# Patient Record
Sex: Male | Born: 1960 | Race: White | Hispanic: No | Marital: Married | Smoking: Never smoker
Health system: Southern US, Community
[De-identification: ages and names within clinical notes are randomized; demographics above are authoritative.]

## PROBLEM LIST (undated history)

## (undated) DIAGNOSIS — I2510 Atherosclerotic heart disease of native coronary artery without angina pectoris: Secondary | ICD-10-CM

## (undated) DIAGNOSIS — E785 Hyperlipidemia, unspecified: Secondary | ICD-10-CM

## (undated) DIAGNOSIS — I10 Essential (primary) hypertension: Secondary | ICD-10-CM

## (undated) DIAGNOSIS — I213 ST elevation (STEMI) myocardial infarction of unspecified site: Secondary | ICD-10-CM

## (undated) HISTORY — DX: Atherosclerotic heart disease of native coronary artery without angina pectoris: I25.10

## (undated) HISTORY — DX: Essential (primary) hypertension: I10

## (undated) HISTORY — DX: Hyperlipidemia, unspecified: E78.5

---

## 2011-03-28 DIAGNOSIS — E785 Hyperlipidemia, unspecified: Secondary | ICD-10-CM | POA: Insufficient documentation

## 2011-03-28 DIAGNOSIS — I10 Essential (primary) hypertension: Secondary | ICD-10-CM | POA: Insufficient documentation

## 2013-03-04 DIAGNOSIS — I213 ST elevation (STEMI) myocardial infarction of unspecified site: Secondary | ICD-10-CM

## 2013-03-04 HISTORY — DX: ST elevation (STEMI) myocardial infarction of unspecified site: I21.3

## 2013-03-15 DIAGNOSIS — I2510 Atherosclerotic heart disease of native coronary artery without angina pectoris: Secondary | ICD-10-CM

## 2013-03-15 HISTORY — DX: Atherosclerotic heart disease of native coronary artery without angina pectoris: I25.10

## 2013-03-15 HISTORY — PX: CORONARY ANGIOPLASTY WITH STENT PLACEMENT: SHX49

## 2013-04-08 ENCOUNTER — Encounter: Payer: Self-pay | Admitting: Cardiovascular Disease

## 2013-04-08 ENCOUNTER — Ambulatory Visit (INDEPENDENT_AMBULATORY_CARE_PROVIDER_SITE_OTHER): Payer: BC Managed Care – PPO | Admitting: Cardiovascular Disease

## 2013-04-08 VITALS — BP 102/66 | Ht 68.5 in | Wt 195.3 lb

## 2013-04-08 DIAGNOSIS — Z9861 Coronary angioplasty status: Secondary | ICD-10-CM

## 2013-04-08 DIAGNOSIS — I2510 Atherosclerotic heart disease of native coronary artery without angina pectoris: Secondary | ICD-10-CM | POA: Insufficient documentation

## 2013-04-08 DIAGNOSIS — Z79899 Other long term (current) drug therapy: Secondary | ICD-10-CM

## 2013-04-08 DIAGNOSIS — E782 Mixed hyperlipidemia: Secondary | ICD-10-CM

## 2013-04-08 NOTE — Patient Instructions (Addendum)
Your physician wants you to follow-up in 6 months Dr San Morelle will receive a reminder letter in the mail two months in advance. If you don't receive a letter, please call our office to schedule the follow-up appointment.  IN 2 MONTHS LABS LIPID, LIVER  PROFILE  You have been referred to cardiac rehab at [facility].

## 2013-04-08 NOTE — Assessment & Plan Note (Signed)
Patient had a STEMI in [place] last month. He went to the [facility] ER where he was noted to have ST segment elevation in the anterior leads. He received intravenous lytics and was transferred to [facility] where he underwent emergent cardiac catheterization revealing an occluded RCA which was stented with a 3 mm x 18 bone along the sides drug-eluting stent. His EF was normal as was his LAD and circumflex coronary artery. His risk factors include mild hyperlipidemia. He is on dual antiplatelet therapy including aspirin and Plavix.

## 2013-04-08 NOTE — Assessment & Plan Note (Signed)
On statin therapy. We will recheck a lipid and liver profile in 2 months 

## 2013-04-08 NOTE — Progress Notes (Signed)
04/08/2013 Dierdre Harness   Oct 09, 1960  [id number]  Primary Physician No PCP Per Patient Primary Cardiologist: Runell Gess MD Roseanne Reno   HPI:  Mr. 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 He was self-referred to be established in our practice because of recent STEMI that occurred in [place]. He underwent emergency cardiac catheterization, PCI and stenting of his RCA using a drug-eluting stent. He was placed on appropriate oncologic therapy including beta blocker, ACE inhibitor, statin drug. He is unable to therapy. He's had no recurrent symptoms. His only cardiovascular risk factor was mild hyperlipidemia.   Current Outpatient Prescriptions  Medication Sig Dispense Refill  . aspirin EC 81 MG tablet Take 81 mg by mouth daily.      Marland Kitchen atorvastatin (LIPITOR) 80 MG tablet Take 80 mg by mouth daily.       . clopidogrel (PLAVIX) 75 MG tablet Take 75 mg by mouth daily.       Marland Kitchen lisinopril (PRINIVIL,ZESTRIL) 5 MG tablet Take 5 mg by mouth daily.       . metoprolol tartrate (LOPRESSOR) 25 MG tablet Take 25 mg by mouth 2 (two) times daily.       Marland Kitchen NITROSTAT 0.4 MG SL tablet Place 0.4 mg under the tongue every 5 (five) minutes as needed.       . pantoprazole (PROTONIX) 40 MG tablet Take 40 mg by mouth daily.        No current facility-administered medications for this visit.    No Known Allergies  History   Social History  . Marital Status: Married    Spouse Name: N/A    Number of Children: N/A  . Years of Education: N/A   Occupational History  . Not on file.   Social History Main Topics  . Smoking status: Never Smoker   . Smokeless tobacco: Not on file  . Alcohol Use: Yes  . Drug Use: No  . Sexual Activity: Not on file   Other Topics Concern  . Not on file   Social History Narrative  . No narrative on file     Review of Systems: General: negative for chills,  fever, night sweats or weight changes.  Cardiovascular: negative for chest pain, dyspnea on exertion, edema, orthopnea, palpitations, paroxysmal nocturnal dyspnea or shortness of breath Dermatological: negative for rash Respiratory: negative for cough or wheezing Urologic: negative for hematuria Abdominal: negative for nausea, vomiting, diarrhea, bright red blood per rectum, melena, or hematemesis Neurologic: negative for visual changes, syncope, or dizziness All other systems reviewed and are otherwise negative except as noted above.    Blood pressure 102/66, height 5' 8.5" (1.74 m), weight 195 lb 4.8 oz (88.587 kg).  General appearance: alert and no distress Neck: no adenopathy, no carotid bruit, no JVD, supple, symmetrical, trachea midline and thyroid not enlarged, symmetric, no tenderness/mass/nodules Lungs: clear to auscultation bilaterally Heart: regular rate and rhythm, S1, S2 normal, no murmur, click, rub or gallop Abdomen: soft, non-tender; bowel sounds normal; no masses,  no organomegaly Extremities: extremities normal, atraumatic, no cyanosis or edema Pulses: 2+ and symmetric  EKG sinus bradycardia at 54 with inferior Q waves and T wave inversions in lead III and F  ASSESSMENT AND PLAN:   Coronary artery disease Patient had a STEMI in [place] last month. He went to the [facility] ER where he was noted to have ST segment elevation in the  anterior leads. He received intravenous lytics and was transferred to [facility] where he underwent emergent cardiac catheterization revealing an occluded RCA which was stented with a 3 mm x 18 bone along the sides drug-eluting stent. His EF was normal as was his LAD and circumflex coronary artery. His risk factors include mild hyperlipidemia. He is on dual antiplatelet therapy including aspirin and Plavix.  Hyperlipidemia On statin therapy. We will recheck a lipid and liver profile in 2 months.      Runell Gess MD FACP,FACC,FAHA,  [facility] 04/08/2013 11:46 AM

## 2013-04-11 ENCOUNTER — Ambulatory Visit: Payer: Self-pay | Admitting: Cardiovascular Disease

## 2013-04-26 ENCOUNTER — Telehealth: Payer: Self-pay | Admitting: Cardiovascular Disease

## 2013-04-26 NOTE — Telephone Encounter (Signed)
Spoke w/ Deedie in Scheduling.  Informed Cardiac Rehab schedules their own appts..  Deedie called Cardiac Rehab and informed they do have a work que and schedule their appts.  Staff will look into pt's referral and call back if more information needed.  Call to wife and explained process.  Verbalized understanding.

## 2013-04-26 NOTE — Telephone Encounter (Signed)
Referral already made to Cardiac Rehab on 10.6.14.  Returned call to Leonardo, pt's wife.  Informed message received and referral has been made.  Informed RN will contact Scheduling r/t delay and try to get appt scheduled as soon as possible.  Wife also wants to know if pt can do any exercising before being seen in Cardiac Rehab.  Stated she disagrees w/ Dr. Allyson Sabal on that b/c pt's lack of exercise is what got him in this situation.  RN explained to wife that Cardiac Rehab is to assess pt's exercise tolerance to know what his limitations are, if any, and to help his heart get back to functionally as normal as possible.  Wife advised to have pt comply w/ Dr. Hazle Coca advice to avoid exercising until evaluated in Cardiac Rehab.  Wife verbalized understanding and will await call from scheduling.    Message forwarded to Scheduling to contact pt/wife to set up appt.

## 2013-04-26 NOTE — Telephone Encounter (Signed)
Please call-still have not heard anything getting into Cardiac Rehab.Does she need to do anything to speed this up?Marland Kitchen If not home please call  [phone].

## 2013-05-08 ENCOUNTER — Encounter: Payer: Self-pay | Admitting: Cardiovascular Disease

## 2013-05-09 ENCOUNTER — Encounter (HOSPITAL_COMMUNITY)
Admission: RE | Admit: 2013-05-09 | Discharge: 2013-05-09 | Disposition: A | Discharge status: Home / Self Care | Payer: BC Managed Care – PPO | Source: Ambulatory Visit | Attending: Cardiovascular Disease | Admitting: Cardiovascular Disease

## 2013-05-09 DIAGNOSIS — I2510 Atherosclerotic heart disease of native coronary artery without angina pectoris: Secondary | ICD-10-CM | POA: Insufficient documentation

## 2013-05-09 DIAGNOSIS — Z9861 Coronary angioplasty status: Secondary | ICD-10-CM | POA: Insufficient documentation

## 2013-05-09 DIAGNOSIS — Z5189 Encounter for other specified aftercare: Secondary | ICD-10-CM | POA: Insufficient documentation

## 2013-05-09 NOTE — Progress Notes (Signed)
Cardiac Rehab Medication Review by a Pharmacist  Does the patient  feel that his/her medications are working for him/her?  yes  Has the patient been experiencing any side effects to the medications prescribed?  yes  Does the patient measure his/her own blood pressure or blood glucose at home?  yes   Does the patient have any problems obtaining medications due to transportation or finances?   no  Understanding of regimen: excellent Understanding of indications: excellent Potential of compliance: excellent    Pharmacist comments: Patient has been feeling sluggish and is unsure if that is due to the medications. He is very aware of the benefits of taking his medications and thinks they are working for him.  He inquires about the possibility of stopping any of the medications in the future.  He was educated that likely these medications will continue due to their benefits.  He states he understands and will continue them as prescribed. Overall, he is doing well.   Thank you, Piedad Climes, PharmD Clinical Pharmacist - Resident Pager: [phone] Pharmacy: [phone] 05/09/2013 9:10 AM

## 2013-05-13 ENCOUNTER — Encounter (HOSPITAL_COMMUNITY)
Admission: RE | Admit: 2013-05-13 | Discharge: 2013-05-13 | Disposition: A | Discharge status: Home / Self Care | Payer: BC Managed Care – PPO | Source: Ambulatory Visit | Attending: Cardiovascular Disease | Admitting: Cardiovascular Disease

## 2013-05-13 NOTE — Progress Notes (Signed)
Pt started cardiac rehab today.  Pt tolerated light exercise without difficulty. Telemetry rhythm Sinus without ectopy. Vital signs stable. Will continue to monitor the patient throughout  the program.  

## 2013-05-15 ENCOUNTER — Encounter (HOSPITAL_COMMUNITY)
Admission: RE | Admit: 2013-05-15 | Discharge: 2013-05-15 | Disposition: A | Discharge status: Home / Self Care | Payer: BC Managed Care – PPO | Source: Ambulatory Visit | Attending: Cardiovascular Disease | Admitting: Cardiovascular Disease

## 2013-05-17 ENCOUNTER — Encounter (HOSPITAL_COMMUNITY)
Admission: RE | Admit: 2013-05-17 | Discharge: 2013-05-17 | Disposition: A | Discharge status: Home / Self Care | Payer: BC Managed Care – PPO | Source: Ambulatory Visit | Attending: Cardiovascular Disease | Admitting: Cardiovascular Disease

## 2013-05-20 ENCOUNTER — Encounter (HOSPITAL_COMMUNITY)
Admission: RE | Admit: 2013-05-20 | Discharge: 2013-05-20 | Disposition: A | Discharge status: Home / Self Care | Payer: BC Managed Care – PPO | Source: Ambulatory Visit | Attending: Cardiovascular Disease | Admitting: Cardiovascular Disease

## 2013-05-22 ENCOUNTER — Encounter (HOSPITAL_COMMUNITY)
Admission: RE | Admit: 2013-05-22 | Discharge: 2013-05-22 | Disposition: A | Discharge status: Home / Self Care | Payer: BC Managed Care – PPO | Source: Ambulatory Visit | Attending: Cardiovascular Disease | Admitting: Cardiovascular Disease

## 2013-05-22 NOTE — Progress Notes (Signed)
Reviewed home exercise with pt today.  Pt plans to walk at home for exercise.  Reviewed THR, pulse, RPE, sign and symptoms, NTG use, and when to call 911 or MD.  Pt voiced understanding. Devesh Monforte, MA, ACSM RCEP   

## 2013-05-24 ENCOUNTER — Encounter (HOSPITAL_COMMUNITY)
Admission: RE | Admit: 2013-05-24 | Discharge: 2013-05-24 | Disposition: A | Discharge status: Home / Self Care | Payer: BC Managed Care – PPO | Source: Ambulatory Visit | Attending: Cardiovascular Disease | Admitting: Cardiovascular Disease

## 2013-05-27 ENCOUNTER — Encounter (HOSPITAL_COMMUNITY)
Admission: RE | Admit: 2013-05-27 | Discharge: 2013-05-27 | Disposition: A | Discharge status: Home / Self Care | Payer: BC Managed Care – PPO | Source: Ambulatory Visit | Attending: Cardiovascular Disease | Admitting: Cardiovascular Disease

## 2013-05-29 ENCOUNTER — Encounter (HOSPITAL_COMMUNITY)
Admission: RE | Admit: 2013-05-29 | Discharge: 2013-05-29 | Disposition: A | Discharge status: Home / Self Care | Payer: BC Managed Care – PPO | Source: Ambulatory Visit | Attending: Cardiovascular Disease | Admitting: Cardiovascular Disease

## 2013-05-31 ENCOUNTER — Encounter (HOSPITAL_COMMUNITY): Payer: BC Managed Care – PPO

## 2013-06-03 ENCOUNTER — Encounter (HOSPITAL_COMMUNITY)
Admission: RE | Admit: 2013-06-03 | Discharge: 2013-06-03 | Disposition: A | Discharge status: Home / Self Care | Payer: BC Managed Care – PPO | Source: Ambulatory Visit | Attending: Cardiovascular Disease | Admitting: Cardiovascular Disease

## 2013-06-03 DIAGNOSIS — I2510 Atherosclerotic heart disease of native coronary artery without angina pectoris: Secondary | ICD-10-CM | POA: Insufficient documentation

## 2013-06-03 DIAGNOSIS — Z9861 Coronary angioplasty status: Secondary | ICD-10-CM | POA: Insufficient documentation

## 2013-06-03 DIAGNOSIS — Z5189 Encounter for other specified aftercare: Secondary | ICD-10-CM | POA: Insufficient documentation

## 2013-06-05 ENCOUNTER — Encounter (HOSPITAL_COMMUNITY)
Admission: RE | Admit: 2013-06-05 | Discharge: 2013-06-05 | Disposition: A | Discharge status: Home / Self Care | Payer: BC Managed Care – PPO | Source: Ambulatory Visit | Attending: Cardiovascular Disease | Admitting: Cardiovascular Disease

## 2013-06-07 ENCOUNTER — Encounter (HOSPITAL_COMMUNITY)
Admission: RE | Admit: 2013-06-07 | Discharge: 2013-06-07 | Disposition: A | Discharge status: Home / Self Care | Payer: BC Managed Care – PPO | Source: Ambulatory Visit | Attending: Cardiovascular Disease | Admitting: Cardiovascular Disease

## 2013-06-10 ENCOUNTER — Encounter (HOSPITAL_COMMUNITY)
Admission: RE | Admit: 2013-06-10 | Discharge: 2013-06-10 | Disposition: A | Discharge status: Home / Self Care | Payer: BC Managed Care – PPO | Source: Ambulatory Visit | Attending: Cardiovascular Disease | Admitting: Cardiovascular Disease

## 2013-06-12 ENCOUNTER — Encounter (HOSPITAL_COMMUNITY)
Admission: RE | Admit: 2013-06-12 | Discharge: 2013-06-12 | Disposition: A | Discharge status: Home / Self Care | Payer: BC Managed Care – PPO | Source: Ambulatory Visit | Attending: Cardiovascular Disease | Admitting: Cardiovascular Disease

## 2013-06-12 NOTE — Progress Notes (Signed)
Quantae Martel 52 y.o. male Nutrition Note Spoke with pt. Pt's wife is an RD. Nutrition Plan and Nutrition Survey goals reviewed with pt. Pt is following Step 2 of the Therapeutic Lifestyle Changes diet. Pt wants to lose wt. Pt has been trying to lose wt by exercising more and portion control. Wt loss tips reviewed. Pt expressed understanding of the information reviewed. Pt aware of nutrition education classes offered and plans on attending nutrition classes.  Nutrition Diagnosis   Food-and nutrition-related knowledge deficit related to lack of exposure to information as related to diagnosis of: ? CVD ?    Overweight related to excessive energy intake as evidenced by a BMI of 28.9    Nutrition RX/ Estimated Daily Nutrition Needs for: wt loss  1600-2100 Kcal, 45-55 gm fat, 10-14 gm sat fat, 1.6-2.1 gm trans-fat, <1500 mg sodium   Nutrition Intervention   Pt's individual nutrition plan including cholesterol goals reviewed with pt.   Benefits of adopting Therapeutic Lifestyle Changes discussed when Medficts reviewed.   Pt to attend the Portion Distortion class   Pt to attend the  ? Nutrition I class                     ? Nutrition II class   Continue client-centered nutrition education by RD, as part of interdisciplinary care.  Goal(s)   Pt to identify food quantities necessary to achieve: ? wt loss to a goal wt of 172-190 lb (78.6-86.8 kg) at graduation from cardiac rehab.   Monitor and Evaluate progress toward nutrition goal with team. Nutrition Risk:  Low   Mickle Plumb, M.Ed, RD, LDN, CDE 06/12/2013 3:41 PM

## 2013-06-14 ENCOUNTER — Encounter (HOSPITAL_COMMUNITY)
Admission: RE | Admit: 2013-06-14 | Discharge: 2013-06-14 | Disposition: A | Discharge status: Home / Self Care | Payer: BC Managed Care – PPO | Source: Ambulatory Visit | Attending: Cardiovascular Disease | Admitting: Cardiovascular Disease

## 2013-06-14 LAB — LIPID PANEL
Cholesterol: 107 mg/dL (ref 0–200)
HDL: 47 mg/dL (ref >39)
LDL Cholesterol: 46 mg/dL (ref 0–99)
Total CHOL/HDL Ratio: 2.3 Ratio
Triglycerides: 70 mg/dL (ref <150)
VLDL: 14 mg/dL (ref 0–40)

## 2013-06-14 LAB — HEPATIC FUNCTION PANEL
ALT: 22 U/L (ref 0–53)
AST: 18 U/L (ref 0–37)
Albumin: 4.5 g/dL (ref 3.5–5.2)
Alkaline Phosphatase: 49 U/L (ref 39–117)
Bilirubin, Direct: 0.2 mg/dL (ref 0.0–0.3)
Indirect Bilirubin: 0.6 mg/dL (ref 0.0–0.9)
Total Bilirubin: 0.8 mg/dL (ref 0.3–1.2)
Total Protein: 7.3 g/dL (ref 6.0–8.3)

## 2013-06-17 ENCOUNTER — Encounter (HOSPITAL_COMMUNITY)
Admission: RE | Admit: 2013-06-17 | Discharge: 2013-06-17 | Disposition: A | Discharge status: Home / Self Care | Payer: BC Managed Care – PPO | Source: Ambulatory Visit | Attending: Cardiovascular Disease | Admitting: Cardiovascular Disease

## 2013-06-19 ENCOUNTER — Encounter (HOSPITAL_COMMUNITY)
Admission: RE | Admit: 2013-06-19 | Discharge: 2013-06-19 | Disposition: A | Discharge status: Home / Self Care | Payer: BC Managed Care – PPO | Source: Ambulatory Visit | Attending: Cardiovascular Disease | Admitting: Cardiovascular Disease

## 2013-06-21 ENCOUNTER — Encounter (HOSPITAL_COMMUNITY)
Admission: RE | Admit: 2013-06-21 | Discharge: 2013-06-21 | Disposition: A | Discharge status: Home / Self Care | Payer: BC Managed Care – PPO | Source: Ambulatory Visit | Attending: Cardiovascular Disease | Admitting: Cardiovascular Disease

## 2013-06-21 ENCOUNTER — Encounter: Payer: Self-pay | Admitting: *Deleted

## 2013-06-23 ENCOUNTER — Encounter: Payer: Self-pay | Admitting: *Deleted

## 2013-06-24 ENCOUNTER — Encounter (HOSPITAL_COMMUNITY): Payer: BC Managed Care – PPO

## 2013-06-26 ENCOUNTER — Encounter (HOSPITAL_COMMUNITY): Payer: BC Managed Care – PPO

## 2013-06-28 ENCOUNTER — Encounter (HOSPITAL_COMMUNITY): Payer: BC Managed Care – PPO

## 2013-07-01 ENCOUNTER — Encounter (HOSPITAL_COMMUNITY)
Admission: RE | Admit: 2013-07-01 | Discharge: 2013-07-01 | Disposition: A | Discharge status: Home / Self Care | Payer: BC Managed Care – PPO | Source: Ambulatory Visit | Attending: Cardiovascular Disease | Admitting: Cardiovascular Disease

## 2013-07-03 ENCOUNTER — Encounter (HOSPITAL_COMMUNITY)
Admission: RE | Admit: 2013-07-03 | Discharge: 2013-07-03 | Disposition: A | Discharge status: Home / Self Care | Payer: BC Managed Care – PPO | Source: Ambulatory Visit | Attending: Cardiovascular Disease | Admitting: Cardiovascular Disease

## 2013-07-05 ENCOUNTER — Encounter (HOSPITAL_COMMUNITY)
Admission: RE | Admit: 2013-07-05 | Discharge: 2013-07-05 | Disposition: A | Discharge status: Home / Self Care | Payer: BC Managed Care – PPO | Source: Ambulatory Visit | Attending: Cardiovascular Disease | Admitting: Cardiovascular Disease

## 2013-07-05 DIAGNOSIS — I2510 Atherosclerotic heart disease of native coronary artery without angina pectoris: Secondary | ICD-10-CM | POA: Insufficient documentation

## 2013-07-05 DIAGNOSIS — Z5189 Encounter for other specified aftercare: Secondary | ICD-10-CM | POA: Insufficient documentation

## 2013-07-05 DIAGNOSIS — Z9861 Coronary angioplasty status: Secondary | ICD-10-CM | POA: Insufficient documentation

## 2013-07-08 ENCOUNTER — Encounter (HOSPITAL_COMMUNITY)
Admission: RE | Admit: 2013-07-08 | Discharge: 2013-07-08 | Disposition: A | Discharge status: Home / Self Care | Payer: BC Managed Care – PPO | Source: Ambulatory Visit | Attending: Cardiovascular Disease | Admitting: Cardiovascular Disease

## 2013-07-10 ENCOUNTER — Encounter (HOSPITAL_COMMUNITY)
Admission: RE | Admit: 2013-07-10 | Discharge: 2013-07-10 | Disposition: A | Discharge status: Home / Self Care | Payer: BC Managed Care – PPO | Source: Ambulatory Visit | Attending: Cardiovascular Disease | Admitting: Cardiovascular Disease

## 2013-07-12 ENCOUNTER — Encounter (HOSPITAL_COMMUNITY)
Admission: RE | Admit: 2013-07-12 | Discharge: 2013-07-12 | Disposition: A | Discharge status: Home / Self Care | Payer: BC Managed Care – PPO | Source: Ambulatory Visit | Attending: Cardiovascular Disease | Admitting: Cardiovascular Disease

## 2013-07-15 ENCOUNTER — Telehealth (HOSPITAL_COMMUNITY): Payer: Self-pay | Admitting: General Practice

## 2013-07-15 ENCOUNTER — Encounter (HOSPITAL_COMMUNITY): Payer: BC Managed Care – PPO

## 2013-07-17 ENCOUNTER — Encounter (HOSPITAL_COMMUNITY)
Admission: RE | Admit: 2013-07-17 | Discharge: 2013-07-17 | Disposition: A | Discharge status: Home / Self Care | Payer: BC Managed Care – PPO | Source: Ambulatory Visit | Attending: Cardiovascular Disease | Admitting: Cardiovascular Disease

## 2013-07-19 ENCOUNTER — Encounter (HOSPITAL_COMMUNITY)
Admission: RE | Admit: 2013-07-19 | Discharge: 2013-07-19 | Disposition: A | Discharge status: Home / Self Care | Payer: BC Managed Care – PPO | Source: Ambulatory Visit | Attending: Cardiovascular Disease | Admitting: Cardiovascular Disease

## 2013-07-22 ENCOUNTER — Encounter (HOSPITAL_COMMUNITY)
Admission: RE | Admit: 2013-07-22 | Discharge: 2013-07-22 | Disposition: A | Discharge status: Home / Self Care | Payer: BC Managed Care – PPO | Source: Ambulatory Visit | Attending: Cardiovascular Disease | Admitting: Cardiovascular Disease

## 2013-07-24 ENCOUNTER — Encounter (HOSPITAL_COMMUNITY)
Admission: RE | Admit: 2013-07-24 | Discharge: 2013-07-24 | Disposition: A | Discharge status: Home / Self Care | Payer: BC Managed Care – PPO | Source: Ambulatory Visit | Attending: Cardiovascular Disease | Admitting: Cardiovascular Disease

## 2013-07-26 ENCOUNTER — Encounter (HOSPITAL_COMMUNITY)
Admission: RE | Admit: 2013-07-26 | Discharge: 2013-07-26 | Disposition: A | Discharge status: Home / Self Care | Payer: BC Managed Care – PPO | Source: Ambulatory Visit | Attending: Cardiovascular Disease | Admitting: Cardiovascular Disease

## 2013-07-29 ENCOUNTER — Encounter (HOSPITAL_COMMUNITY)
Admission: RE | Admit: 2013-07-29 | Discharge: 2013-07-29 | Disposition: A | Discharge status: Home / Self Care | Payer: BC Managed Care – PPO | Source: Ambulatory Visit | Attending: Cardiovascular Disease | Admitting: Cardiovascular Disease

## 2013-07-31 ENCOUNTER — Encounter (HOSPITAL_COMMUNITY)
Admission: RE | Admit: 2013-07-31 | Discharge: 2013-07-31 | Disposition: A | Discharge status: Home / Self Care | Payer: BC Managed Care – PPO | Source: Ambulatory Visit | Attending: Cardiovascular Disease | Admitting: Cardiovascular Disease

## 2013-08-02 ENCOUNTER — Encounter (HOSPITAL_COMMUNITY)
Admission: RE | Admit: 2013-08-02 | Discharge: 2013-08-02 | Disposition: A | Discharge status: Home / Self Care | Payer: BC Managed Care – PPO | Source: Ambulatory Visit | Attending: Cardiovascular Disease | Admitting: Cardiovascular Disease

## 2013-08-05 ENCOUNTER — Encounter (HOSPITAL_COMMUNITY)
Admission: RE | Admit: 2013-08-05 | Discharge: 2013-08-05 | Disposition: A | Discharge status: Home / Self Care | Payer: BC Managed Care – PPO | Source: Ambulatory Visit | Attending: Cardiovascular Disease | Admitting: Cardiovascular Disease

## 2013-08-05 DIAGNOSIS — Z5189 Encounter for other specified aftercare: Secondary | ICD-10-CM | POA: Insufficient documentation

## 2013-08-05 DIAGNOSIS — I2510 Atherosclerotic heart disease of native coronary artery without angina pectoris: Secondary | ICD-10-CM | POA: Insufficient documentation

## 2013-08-05 DIAGNOSIS — Z9861 Coronary angioplasty status: Secondary | ICD-10-CM | POA: Insufficient documentation

## 2013-08-07 ENCOUNTER — Encounter (HOSPITAL_COMMUNITY)
Admission: RE | Admit: 2013-08-07 | Discharge: 2013-08-07 | Disposition: A | Discharge status: Home / Self Care | Payer: BC Managed Care – PPO | Source: Ambulatory Visit | Attending: Cardiovascular Disease | Admitting: Cardiovascular Disease

## 2013-08-09 ENCOUNTER — Encounter (HOSPITAL_COMMUNITY)
Admission: RE | Admit: 2013-08-09 | Discharge: 2013-08-09 | Disposition: A | Discharge status: Home / Self Care | Payer: BC Managed Care – PPO | Source: Ambulatory Visit | Attending: Cardiovascular Disease | Admitting: Cardiovascular Disease

## 2013-08-12 ENCOUNTER — Encounter (HOSPITAL_COMMUNITY)
Admission: RE | Admit: 2013-08-12 | Discharge: 2013-08-12 | Disposition: A | Discharge status: Home / Self Care | Payer: BC Managed Care – PPO | Source: Ambulatory Visit | Attending: Cardiovascular Disease | Admitting: Cardiovascular Disease

## 2013-08-14 ENCOUNTER — Encounter (HOSPITAL_COMMUNITY)
Admission: RE | Admit: 2013-08-14 | Discharge: 2013-08-14 | Disposition: A | Discharge status: Home / Self Care | Payer: BC Managed Care – PPO | Source: Ambulatory Visit | Attending: Cardiovascular Disease | Admitting: Cardiovascular Disease

## 2013-08-14 NOTE — Progress Notes (Signed)
[employer] graduates today. Mardelle Mattendy plans to continue exercise on his own.

## 2013-08-16 ENCOUNTER — Encounter (HOSPITAL_COMMUNITY): Payer: BC Managed Care – PPO

## 2013-09-20 ENCOUNTER — Telehealth: Payer: Self-pay | Admitting: Cardiovascular Disease

## 2013-09-20 MED ORDER — COENZYME Q10 30 MG PO CAPS: 30.0000 mg | ORAL_CAPSULE | Freq: Every day | ORAL | Status: AC

## 2013-09-20 MED ORDER — CLOPIDOGREL BISULFATE 75 MG PO TABS: 75.0000 mg | ORAL_TABLET | Freq: Every day | ORAL | Status: DC

## 2013-09-20 MED ORDER — METOPROLOL TARTRATE 25 MG PO TABS: 25.0000 mg | ORAL_TABLET | Freq: Two times a day (BID) | ORAL | Status: DC

## 2013-09-20 MED ORDER — LISINOPRIL 5 MG PO TABS: 5.0000 mg | ORAL_TABLET | Freq: Every day | ORAL | Status: DC

## 2013-09-20 MED ORDER — ATORVASTATIN CALCIUM 80 MG PO TABS: 80.0000 mg | ORAL_TABLET | Freq: Every day | ORAL | Status: DC

## 2013-09-20 MED ORDER — NITROGLYCERIN 0.4 MG SL SUBL: 0.4000 mg | SUBLINGUAL_TABLET | SUBLINGUAL | Status: DC | PRN

## 2013-09-20 NOTE — Telephone Encounter (Signed)
Stating that the pharmacy is asking for new prescriptions for his medication  From Dr. Allyson SabalBerry . Patient stated that he had a heart [facility] last year in [facility] and his medication was written by the providers in [facility], but have since seen Dr. Allyson SabalBerry .   Please send to the [facility] [address] .Marland Kitchen. [phone].Marland Kitchen. Please call if have any questions

## 2013-09-20 NOTE — Telephone Encounter (Signed)
Refills sent to pharmacy. 

## 2013-10-07 ENCOUNTER — Ambulatory Visit: Payer: BC Managed Care – PPO | Admitting: Cardiovascular Disease

## 2013-10-09 ENCOUNTER — Telehealth: Payer: Self-pay | Admitting: Cardiovascular Disease

## 2013-10-09 MED ORDER — ATORVASTATIN CALCIUM 80 MG PO TABS: 80.0000 mg | ORAL_TABLET | Freq: Every day | ORAL | Status: DC

## 2013-10-09 MED ORDER — METOPROLOL TARTRATE 25 MG PO TABS: 25.0000 mg | ORAL_TABLET | Freq: Two times a day (BID) | ORAL | Status: DC

## 2013-10-09 NOTE — Telephone Encounter (Signed)
Refills sent eletronically for Metoprolol and Atorvastatin.  

## 2013-10-09 NOTE — Telephone Encounter (Signed)
Refills sent eletronically for Metoprolol and Atorvastatin.

## 2013-10-09 NOTE — Telephone Encounter (Signed)
Pt need new prescriptions for Metoprolol 25 mg #60 and Atorvastatin 80mg  #30. Please call to [facility] [phone].

## 2013-10-15 ENCOUNTER — Ambulatory Visit (INDEPENDENT_AMBULATORY_CARE_PROVIDER_SITE_OTHER): Payer: BC Managed Care – PPO | Admitting: Cardiovascular Disease

## 2013-10-15 ENCOUNTER — Encounter: Payer: Self-pay | Admitting: Cardiovascular Disease

## 2013-10-15 VITALS — BP 110/70 | HR 59 | Ht 68.0 in | Wt 189.0 lb

## 2013-10-15 DIAGNOSIS — I2510 Atherosclerotic heart disease of native coronary artery without angina pectoris: Secondary | ICD-10-CM

## 2013-10-15 DIAGNOSIS — Z79899 Other long term (current) drug therapy: Secondary | ICD-10-CM

## 2013-10-15 DIAGNOSIS — E785 Hyperlipidemia, unspecified: Secondary | ICD-10-CM

## 2013-10-15 MED ORDER — ATORVASTATIN CALCIUM 80 MG PO TABS: 40.0000 mg | ORAL_TABLET | Freq: Every day | ORAL | Status: DC

## 2013-10-15 MED ORDER — METOPROLOL SUCCINATE ER 25 MG PO TB24: 25.0000 mg | ORAL_TABLET | Freq: Every day | ORAL | Status: DC

## 2013-10-15 NOTE — Patient Instructions (Signed)
   Labwork:  Please have blood work done in 2-3 months  Medication Changes: STOP metoprolol tartrate START metoprolol succinate 25mg  daily Decrease atorvastatin to 40mg  daily   Your physician wants you to follow-up with him in : 1 year                                                                You will receive a reminder letter in the mail one month in advance. If you don't receive a letter, please call our office to schedule the follow-up appointment.

## 2013-10-15 NOTE — Progress Notes (Signed)
10/15/2013 Dierdre HarnessAndrew Weida   11/01/1960  [id number]  Primary Physician No PCP Per Patient Primary Cardiologist: Runell GessJonathan J. Helma Argyle MD Roseanne RenoFACP,FACC,FAHA, FSCAI   HPI:  Mr. Reginald [name] is a delightful 53 year old married Caucasian male father of 2 sons age 52 and 1015 works as a Sports administratorrestaurant owner. He was self-referred to be established in our practice because of recent STEM[place] that [place]. He underwent emergency cardiac catheterization, PCI and stenting of his RCA using a drug-eluting stent. He was placed on appropriate oncologic therapy including beta blocker, ACE inhibitor, statin drug. He is unable to therapy. He's had no recurrent symptoms. His only cardiovascular risk factor was mild hyperlipidemia.    Current Outpatient Prescriptions  Medication Sig Dispense Refill  . aspirin EC 81 MG tablet Take 81 mg by mouth daily.      Marland Kitchen. atorvastatin (LIPITOR) 80 MG tablet Take 1 tablet (80 mg total) by mouth daily.  30 tablet  5  . clopidogrel (PLAVIX) 75 MG tablet Take 1 tablet (75 mg total) by mouth daily.  30 tablet  5  . co-enzyme Q-10 30 MG capsule Take 1 capsule (30 mg total) by mouth daily.  30 capsule  5  . lisinopril (PRINIVIL,ZESTRIL) 5 MG tablet Take 1 tablet (5 mg total) by mouth daily.  30 tablet  5  . metoprolol tartrate (LOPRESSOR) 25 MG tablet Take 1 tablet (25 mg total) by mouth 2 (two) times daily.  60 tablet  5  . nitroGLYCERIN (NITROSTAT) 0.4 MG SL tablet Place 1 tablet (0.4 mg total) under the tongue every 5 (five) minutes as needed.  25 tablet  1   No current facility-administered medications for this visit.    No Known Allergies  History   Social History  . Marital Status: Married    Spouse Name: N/A    Number of Children: N/A  . Years of Education: N/A   Occupational History  . Not on file.   Social History Main Topics  . Smoking status: Never Smoker   . Smokeless tobacco: Not on file  . Alcohol Use: Yes  . Drug Use: No  . Sexual Activity:  Not on file   Other Topics Concern  . Not on file   Social History Narrative  . No narrative on file     Review of Systems: General: negative for chills, fever, night sweats or weight changes.  Cardiovascular: negative for chest pain, dyspnea on exertion, edema, orthopnea, palpitations, paroxysmal nocturnal dyspnea or shortness of breath Dermatological: negative for rash Respiratory: negative for cough or wheezing Urologic: negative for hematuria Abdominal: negative for nausea, vomiting, diarrhea, bright red blood per rectum, melena, or hematemesis Neurologic: negative for visual changes, syncope, or dizziness All other systems reviewed and are otherwise negative except as noted above.    Blood pressure 110/70, pulse 59, height 5\' 8"  (1.727 m), weight 189 lb (85.73 kg).  General appearance: alert and no distress Neck: no adenopathy, no carotid bruit, no JVD, supple, symmetrical, trachea midline and thyroid not enlarged, symmetric, no tenderness/mass/nodules Lungs: clear to auscultation bilaterally Heart: regular rate and rhythm, S1, S2 normal, no murmur, click, rub or gallop Extremities: extremities normal, atraumatic, no cyanosis or edema  EKG sinus bradycardia at 59 with nonspecific ST and T wave changes  ASSESSMENT AND PLAN:   Coronary artery disease Status post STEMI in [place] undergoing emergency cardiac catheterization, PCI and stenting of his RCA is a drug-eluting stent. He denies chest pain or shortness of  breath.  Hyperlipidemia On statin therapy with excellent lipid profile checked 06/14/13 with a total cholesterol of 107, LDL of 46 and HDL of 47      Runell GessJonathan J. Chantille Navarrete MD [facility]ACP,FACC,FAHA, [facility] 10/15/2013 2:24 PM.jbov

## 2013-10-15 NOTE — Assessment & Plan Note (Signed)
Status post STEMI in [place] undergoing emergency cardiac catheterization, PCI and stenting of his RCA is a drug-eluting stent. He denies chest pain or shortness of breath.

## 2013-10-15 NOTE — Assessment & Plan Note (Signed)
On statin therapy with excellent lipid profile checked 06/14/13 with a total cholesterol of 107, LDL of 46 and HDL of 47

## 2013-12-03 ENCOUNTER — Other Ambulatory Visit: Payer: Self-pay | Admitting: *Deleted

## 2013-12-03 MED ORDER — ATORVASTATIN CALCIUM 40 MG PO TABS
40.0000 mg | ORAL_TABLET | Freq: Every day | ORAL | Status: DC
Start: 2013-12-03 — End: 2015-01-02

## 2014-03-06 ENCOUNTER — Other Ambulatory Visit: Payer: Self-pay | Admitting: Cardiovascular Disease

## 2014-03-06 NOTE — Telephone Encounter (Signed)
Rx was sent to pharmacy electronically. 

## 2014-07-17 ENCOUNTER — Ambulatory Visit
Admission: RE | Admit: 2014-07-17 | Discharge: 2014-07-17 | Disposition: A | Discharge status: Home / Self Care | Payer: BLUE CROSS/BLUE SHIELD | Source: Ambulatory Visit | Attending: Family Medicine | Admitting: Family Medicine

## 2014-07-17 ENCOUNTER — Other Ambulatory Visit: Payer: Self-pay | Admitting: Family Medicine

## 2014-07-17 DIAGNOSIS — R51 Headache: Principal | ICD-10-CM

## 2014-07-17 DIAGNOSIS — R519 Headache, unspecified: Secondary | ICD-10-CM

## 2014-07-18 ENCOUNTER — Other Ambulatory Visit: Payer: Self-pay | Admitting: Family Medicine

## 2014-07-18 DIAGNOSIS — G4489 Other headache syndrome: Secondary | ICD-10-CM

## 2014-09-01 ENCOUNTER — Other Ambulatory Visit: Payer: Self-pay | Admitting: Cardiovascular Disease

## 2014-09-01 NOTE — Telephone Encounter (Signed)
Rx has been sent to the pharmacy electronically. ° °

## 2014-11-04 ENCOUNTER — Other Ambulatory Visit: Payer: Self-pay | Admitting: Cardiovascular Disease

## 2014-11-25 ENCOUNTER — Encounter: Payer: Self-pay | Admitting: Cardiovascular Disease

## 2014-11-25 ENCOUNTER — Ambulatory Visit (INDEPENDENT_AMBULATORY_CARE_PROVIDER_SITE_OTHER): Payer: BLUE CROSS/BLUE SHIELD | Admitting: Cardiovascular Disease

## 2014-11-25 VITALS — BP 122/80 | HR 67 | Ht 69.0 in | Wt 200.0 lb

## 2014-11-25 DIAGNOSIS — E785 Hyperlipidemia, unspecified: Secondary | ICD-10-CM

## 2014-11-25 DIAGNOSIS — I2510 Atherosclerotic heart disease of native coronary artery without angina pectoris: Secondary | ICD-10-CM

## 2014-11-25 DIAGNOSIS — Z79899 Other long term (current) drug therapy: Secondary | ICD-10-CM | POA: Diagnosis not present

## 2014-11-25 NOTE — Progress Notes (Signed)
11/25/2014 Dierdre Harness   [birth year]  [id number]  Primary Physician No PCP Per Patient Primary Cardiologist: Runell Gess MD Roseanne Reno   HPI:  Mr. 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 He underwent emergency cardiac catheterization, PCI and stenting of his RCA using a drug-eluting stent. He was placed on appropriate oncologic therapy including beta blocker, ACE inhibitor, statin drug. He is unable to therapy. He's had no recurrent symptoms. His only cardiovascular risk factor was mild hyperlipidemia. Since I saw him one year ago he assuming currently stable specifically denying any cardiovascular symptoms.   Current Outpatient Prescriptions  Medication Sig Dispense Refill  . aspirin EC 81 MG tablet Take 81 mg by mouth daily.    Marland Kitchen atorvastatin (LIPITOR) 40 MG tablet Take 1 tablet (40 mg total) by mouth daily. 90 tablet 3  . clopidogrel (PLAVIX) 75 MG tablet TAKE 1 TABLET (75 MG TOTAL) BY MOUTH DAILY. 30 tablet 0  . co-enzyme Q-10 30 MG capsule Take 1 capsule (30 mg total) by mouth daily. 30 capsule 5  . lisinopril (PRINIVIL,ZESTRIL) 5 MG tablet TAKE 1 TABLET (5 MG TOTAL) BY MOUTH DAILY. 30 tablet 0  . metoprolol succinate (TOPROL-XL) 25 MG 24 hr tablet TAKE 1 TABLET (25 MG TOTAL) BY MOUTH DAILY. 30 tablet 0  . nitroGLYCERIN (NITROSTAT) 0.4 MG SL tablet Place 1 tablet (0.4 mg total) under the tongue every 5 (five) minutes as needed. 25 tablet 1   No current facility-administered medications for this visit.    No Known Allergies  History   Social History  . Marital Status: Married    Spouse Name: N/A  . Number of Children: N/A  . Years of Education: N/A   Occupational History  . Not on file.   Social History Main Topics  . Smoking status: Never Smoker   .  Smokeless tobacco: Not on file  . Alcohol Use: Yes  . Drug Use: No  . Sexual Activity: Not on file   Other Topics Concern  . Not on file   Social History Narrative     Review of Systems: General: negative for chills, fever, night sweats or weight changes.  Cardiovascular: negative for chest pain, dyspnea on exertion, edema, orthopnea, palpitations, paroxysmal nocturnal dyspnea or shortness of breath Dermatological: negative for rash Respiratory: negative for cough or wheezing Urologic: negative for hematuria Abdominal: negative for nausea, vomiting, diarrhea, bright red blood per rectum, melena, or hematemesis Neurologic: negative for visual changes, syncope, or dizziness All other systems reviewed and are otherwise negative except as noted above.    Blood pressure 122/80, pulse 67, height  (1.753 m), weight 200 lb (90.719 kg).  General appearance: alert and no distress Neck: no adenopathy, no carotid bruit, no JVD, supple, symmetrical, trachea midline and thyroid not enlarged, symmetric, no tenderness/mass/nodules Lungs: clear to auscultation bilaterally Heart: regular rate and rhythm, S1, S2 normal, no murmur, click, rub or gallop Extremities: extremities normal, atraumatic, no cyanosis or edema  EKG normal sinus rhythm at 57 without ST or T-wave changes. I personally reviewed this EKG  ASSESSMENT AND PLAN:   Hyperlipidemia History of hyperlipidemia on atorvastatin 40 mg a day. I'm going to recheck a lipid and liver profile   Coronary artery disease History of CAD status post [place] [facility]  [place]. He underwent urgent PCI and stenting of his RCA using a drug eluting stent. He has remained on dual antiplatelets  therapy including aspirin and Plavix in addition to other appropriate medications. He denies chest pain or shortness of breath.       Runell GessJonathan J. Imberly Troxler MD FACP,FACC,FAHA, [facility] 11/25/2014 12:30 PM

## 2014-11-25 NOTE — Patient Instructions (Signed)
Your physician recommends that you return for lab work at your earliest convenience - FASTING.  Dr Berry recommends that you schedule a follow-up appointment in 1 year. You will receive a reminder letter in the mail two months in advance. If you don't receive a letter, please call our office to schedule the follow-up appointment. 

## 2014-11-25 NOTE — Assessment & Plan Note (Signed)
History of hyperlipidemia on atorvastatin 40 mg a day. I'm going to recheck a lipid and liver profile

## 2014-11-25 NOTE — Assessment & Plan Note (Signed)
History of CAD status [place]. He underwent urgent PCI and stenting of his RCA using a drug eluting stent. He has remained on dual antiplatelets  therapy including aspirin and Plavix in addition to other appropriate medications. He denies chest pain or shortness of breath.

## 2014-12-04 ENCOUNTER — Other Ambulatory Visit: Payer: Self-pay | Admitting: Cardiovascular Disease

## 2014-12-04 NOTE — Telephone Encounter (Signed)
Rx(s) sent to pharmacy electronically.  

## 2014-12-10 LAB — LIPID PANEL
Cholesterol: 151 mg/dL (ref 0–200)
HDL: 57 mg/dL (ref >=40)
LDL Cholesterol: 67 mg/dL (ref 0–99)
Total CHOL/HDL Ratio: 2.6 Ratio
Triglycerides: 137 mg/dL (ref <150)
VLDL: 27 mg/dL (ref 0–40)

## 2014-12-10 LAB — HEPATIC FUNCTION PANEL
ALT: 24 U/L (ref 0–53)
AST: 18 U/L (ref 0–37)
Albumin: 4.1 g/dL (ref 3.5–5.2)
Alkaline Phosphatase: 42 U/L (ref 39–117)
Bilirubin, Direct: 0.2 mg/dL (ref 0.0–0.3)
Indirect Bilirubin: 0.5 mg/dL (ref 0.2–1.2)
Total Bilirubin: 0.7 mg/dL (ref 0.2–1.2)
Total Protein: 7.3 g/dL (ref 6.0–8.3)

## 2015-01-02 ENCOUNTER — Other Ambulatory Visit: Payer: Self-pay | Admitting: Cardiovascular Disease

## 2015-01-02 ENCOUNTER — Other Ambulatory Visit: Payer: Self-pay | Admitting: *Deleted

## 2015-01-02 NOTE — Telephone Encounter (Signed)
REFILL 

## 2015-09-07 ENCOUNTER — Other Ambulatory Visit: Payer: Self-pay | Admitting: Cardiovascular Disease

## 2015-09-07 NOTE — Telephone Encounter (Signed)
Rx(s) sent to pharmacy electronically. Due for appt in May 2017

## 2015-09-30 ENCOUNTER — Other Ambulatory Visit: Payer: Self-pay | Admitting: Cardiovascular Disease

## 2015-10-01 ENCOUNTER — Other Ambulatory Visit: Payer: Self-pay | Admitting: *Deleted

## 2015-10-01 MED ORDER — METOPROLOL SUCCINATE ER 25 MG PO TB24: 25.0000 mg | ORAL_TABLET | Freq: Every day | ORAL | Status: DC

## 2015-10-01 NOTE — Telephone Encounter (Signed)
Rx(s) sent to pharmacy electronically.  

## 2015-11-16 ENCOUNTER — Other Ambulatory Visit: Payer: Self-pay | Admitting: Cardiovascular Disease

## 2015-11-16 NOTE — Telephone Encounter (Signed)
Rx request sent to pharmacy.  

## 2015-12-21 ENCOUNTER — Other Ambulatory Visit: Payer: Self-pay | Admitting: Cardiovascular Disease

## 2016-01-25 ENCOUNTER — Other Ambulatory Visit: Payer: Self-pay | Admitting: Cardiovascular Disease

## 2016-02-29 ENCOUNTER — Other Ambulatory Visit: Payer: Self-pay | Admitting: Cardiovascular Disease

## 2016-02-29 NOTE — Telephone Encounter (Signed)
Rx(s) sent to pharmacy electronically.  

## 2016-03-21 ENCOUNTER — Other Ambulatory Visit: Payer: Self-pay | Admitting: Cardiovascular Disease

## 2016-03-21 DIAGNOSIS — H9201 Otalgia, right ear: Secondary | ICD-10-CM | POA: Diagnosis not present

## 2016-03-21 NOTE — Telephone Encounter (Signed)
Rx has been sent to the pharmacy electronically. ° °

## 2016-04-05 ENCOUNTER — Encounter: Payer: Self-pay | Admitting: Cardiovascular Disease

## 2016-04-05 ENCOUNTER — Ambulatory Visit (INDEPENDENT_AMBULATORY_CARE_PROVIDER_SITE_OTHER): Payer: BLUE CROSS/BLUE SHIELD | Admitting: Cardiovascular Disease

## 2016-04-05 VITALS — BP 126/82 | HR 59 | Ht 69.0 in | Wt 205.2 lb

## 2016-04-05 DIAGNOSIS — I2510 Atherosclerotic heart disease of native coronary artery without angina pectoris: Secondary | ICD-10-CM | POA: Diagnosis not present

## 2016-04-05 MED ORDER — METOPROLOL SUCCINATE ER 25 MG PO TB24: 25.0000 mg | ORAL_TABLET | Freq: Every day | ORAL | 3 refills | Status: DC

## 2016-04-05 MED ORDER — CLOPIDOGREL BISULFATE 75 MG PO TABS: ORAL_TABLET | ORAL | 3 refills | Status: DC

## 2016-04-05 MED ORDER — NITROGLYCERIN 0.4 MG SL SUBL: 0.4000 mg | SUBLINGUAL_TABLET | SUBLINGUAL | 2 refills | Status: AC | PRN

## 2016-04-05 MED ORDER — LISINOPRIL 5 MG PO TABS: 5.0000 mg | ORAL_TABLET | Freq: Every day | ORAL | 3 refills | Status: DC

## 2016-04-05 MED ORDER — ATORVASTATIN CALCIUM 40 MG PO TABS: ORAL_TABLET | ORAL | 3 refills | Status: DC

## 2016-04-05 NOTE — Assessment & Plan Note (Signed)
History of CAD status post STEM[place] occurred [place]. He had PCI and stenting using drug-eluting stent of his RCA. He remains on dual antiplatelet therapy. He is asymptomatic.

## 2016-04-05 NOTE — Assessment & Plan Note (Signed)
History of hyperlipidemia on statin therapy. This is followed by his PCP.

## 2016-04-05 NOTE — Patient Instructions (Signed)
Medication Instructions:  NO CHANGES.   Follow-Up: Your physician wants you to follow-up in: 12 MONTHS WITH DR BERRY.  You will receive a reminder letter in the mail two months in advance. If you don't receive a letter, please call our office to schedule the follow-up appointment.   If you need a refill on your cardiac medications before your next appointment, please call your pharmacy.   

## 2016-04-05 NOTE — Progress Notes (Signed)
04/05/2016 Reginald [name]   04/08/1961  [id number]  Primary Physician Redmond BasemanWONG,FRANCIS PATRICK, MD Primary Cardiologist: Runell GessJonathan J Rashi Giuliani MD Roseanne RenoFACP, FACC, FAHA, FSCAI  HPI:  Mr. Reginald [name] is a delightful 55 year old married Caucasian male father of 2 sons age 11014 and 5416 works as a Sports administratorrestaurant owner of the [employer] . He was self-referred to be established in our practice because of recent STEM[place] that [place]. He underwent emergency cardiac catheterization, PCI and stenting of his RCA using a drug-eluting stent. He was placed on appropriate tool antiplatelet therapy including beta blocker, ACE inhibitor, statin drug.He's had no recurrent symptoms. His only cardiovascular risk factor was mild hyperlipidemia. Since I saw him one year ago he is currently stable specifically denying any cardiovascular symptoms.   Current Outpatient Prescriptions  Medication Sig Dispense Refill  . aspirin EC 81 MG tablet Take 81 mg by mouth daily.    Marland Kitchen. atorvastatin (LIPITOR) 40 MG tablet TAKE 1 TABLET (40 MG TOTAL) BY MOUTH DAILY. 90 tablet 2  . clopidogrel (PLAVIX) 75 MG tablet TAKE 1 TABLET (75 MG TOTAL) BY MOUTH DAILY. 30 tablet 10  . co-enzyme Q-10 30 MG capsule Take 1 capsule (30 mg total) by mouth daily. 30 capsule 5  . lisinopril (PRINIVIL,ZESTRIL) 5 MG tablet TAKE ONE TABLET BY MOUTH DAILY  --NEED OFFICE VISIT FOR REFILLS 30 tablet 0  . metoprolol succinate (TOPROL-XL) 25 MG 24 hr tablet TAKE ONE TABLET BY MOUTH DAILY --NEED OFFICE VISIT FOR REFILLS 30 tablet 0  . nitroGLYCERIN (NITROSTAT) 0.4 MG SL tablet Place 1 tablet (0.4 mg total) under the tongue every 5 (five) minutes as needed. 25 tablet 1   No current facility-administered medications for this visit.     No Known Allergies  Social History   Social History  . Marital status: Married    Spouse name: N/A  . Number of children: N/A  . Years of education: N/A   Occupational History  . Not on file.   Social  History Main Topics  . Smoking status: Never Smoker  . Smokeless tobacco: Not on file  . Alcohol use Yes  . Drug use: No  . Sexual activity: Not on file   Other Topics Concern  . Not on file   Social History Narrative  . No narrative on file     Review of Systems: General: negative for chills, fever, night sweats or weight changes.  Cardiovascular: negative for chest pain, dyspnea on exertion, edema, orthopnea, palpitations, paroxysmal nocturnal dyspnea or shortness of breath Dermatological: negative for rash Respiratory: negative for cough or wheezing Urologic: negative for hematuria Abdominal: negative for nausea, vomiting, diarrhea, bright red blood per rectum, melena, or hematemesis Neurologic: negative for visual changes, syncope, or dizziness All other systems reviewed and are otherwise negative except as noted above.    Blood pressure 126/82, pulse (!) 59, height 5\' 9"  (1.753 m), weight 205 lb 3.2 oz (93.1 kg), SpO2 97 %.  General appearance: alert and no distress Neck: no adenopathy, no carotid bruit, no JVD, supple, symmetrical, trachea midline and thyroid not enlarged, symmetric, no tenderness/mass/nodules Lungs: clear to auscultation bilaterally Heart: regular rate and rhythm, S1, S2 normal, no murmur, click, rub or gallop Extremities: extremities normal, atraumatic, no cyanosis or edema  EKG sinus bradycardia 59 without ST or T-wave changes. I perceive reviewed this EKG  ASSESSMENT AND PLAN:   Coronary artery disease History of CAD status post [place]. He had PCI and stenting using drug-eluting stent of his RCA. He remains on dual antiplatelet therapy. He is asymptomatic.  Hyperlipidemia History of hyperlipidemia on statin therapy. This is followed by his PCP.      Runell Gess MD FACP,FACC,FAHA, [facility] 04/05/2016 11:19 AM

## 2016-08-08 DIAGNOSIS — R51 Headache: Secondary | ICD-10-CM | POA: Diagnosis not present

## 2016-12-06 DIAGNOSIS — Z125 Encounter for screening for malignant neoplasm of prostate: Secondary | ICD-10-CM | POA: Diagnosis not present

## 2016-12-06 DIAGNOSIS — R5383 Other fatigue: Secondary | ICD-10-CM | POA: Diagnosis not present

## 2016-12-06 DIAGNOSIS — Z0000 Encounter for general adult medical examination without abnormal findings: Secondary | ICD-10-CM | POA: Diagnosis not present

## 2016-12-06 DIAGNOSIS — I2510 Atherosclerotic heart disease of native coronary artery without angina pectoris: Secondary | ICD-10-CM | POA: Diagnosis not present

## 2017-05-24 ENCOUNTER — Other Ambulatory Visit: Payer: Self-pay | Admitting: Cardiovascular Disease

## 2017-05-30 DIAGNOSIS — M7581 Other shoulder lesions, right shoulder: Secondary | ICD-10-CM | POA: Diagnosis not present

## 2017-05-30 DIAGNOSIS — M542 Cervicalgia: Secondary | ICD-10-CM | POA: Diagnosis not present

## 2017-09-25 ENCOUNTER — Other Ambulatory Visit: Payer: Self-pay | Admitting: Cardiovascular Disease

## 2017-11-01 ENCOUNTER — Ambulatory Visit: Payer: BLUE CROSS/BLUE SHIELD | Admitting: Cardiovascular Disease

## 2017-11-01 ENCOUNTER — Encounter: Payer: Self-pay | Admitting: Cardiovascular Disease

## 2017-11-01 VITALS — BP 124/82 | HR 79 | Ht 69.0 in | Wt 213.0 lb

## 2017-11-01 DIAGNOSIS — E7800 Pure hypercholesterolemia, unspecified: Secondary | ICD-10-CM | POA: Diagnosis not present

## 2017-11-01 DIAGNOSIS — I2510 Atherosclerotic heart disease of native coronary artery without angina pectoris: Secondary | ICD-10-CM

## 2017-11-01 MED ORDER — METOPROLOL SUCCINATE ER 25 MG PO TB24: 12.5000 mg | ORAL_TABLET | Freq: Every day | ORAL | 6 refills | Status: DC

## 2017-11-01 NOTE — Progress Notes (Signed)
11/01/2017 Dierdre Harness   07/18/1960  [id number]  Primary Physician Ileana Ladd, MD Primary Cardiologist: Runell Gess MD Nicholes Calamity, [place]  HPI:  Reginald [name] is a 57 y.o.  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 I last saw him in the office 04/05/16. He was self-referred to be established in our practice because of recent STEMI that occurred in [place]. He underwent emergency cardiac catheterization, PCI and stenting of his RCA using a drug-eluting stent. He was placed on appropriate tool antiplatelet therapy including beta blocker, ACE inhibitor, statin drug.He's had no recurrent symptoms. His only cardiovascular risk factor was mild hyperlipidemia. Since I saw him 1-1/2 years ago he is currently stable specifically denying any cardiovascular symptoms. His major symptoms are fatigue which may be related to his beta blocker.      Current Meds  Medication Sig  . aspirin EC 81 MG tablet Take 81 mg by mouth daily.  Marland Kitchen atorvastatin (LIPITOR) 40 MG tablet TAKE ONE TABLET BY MOUTH DAILY *NEED APPOINTMENT FOR REFILLS  . Cholecalciferol (VITAMIN D3) 2000 units TABS Take 2,000 Units by mouth daily.  . clopidogrel (PLAVIX) 75 MG tablet TAKE ONE TABLET BY MOUTH DAILY **MUST CALL MD FOR APPOINTMENT  . co-enzyme Q-10 30 MG capsule Take 1 capsule (30 mg total) by mouth daily.  Marland Kitchen lisinopril (PRINIVIL,ZESTRIL) 5 MG tablet TAKE ONE TABLET BY MOUTH DAILY **MUST CALL MD FOR APPOINTMENT  . metoprolol succinate (TOPROL-XL) 25 MG 24 hr tablet Take 0.5 tablets (12.5 mg total) by mouth daily.  . nitroGLYCERIN (NITROSTAT) 0.4 MG SL tablet Place 1 tablet (0.4 mg total) under the tongue every 5 (five) minutes as needed.  . [DISCONTINUED] metoprolol succinate (TOPROL-XL) 25 MG 24 hr tablet TAKE ONE TABLET BY MOUTH DAILY *NEED AN  APPOINTMENT FOR REFILLS     No Known Allergies  Social History    Socioeconomic History  . Marital status: Married    Spouse name: Not on file  . Number of children: Not on file  . Years of education: Not on file  . Highest education level: Not on file  Occupational History  . Not on file  Social Needs  . Financial resource strain: Not on file  . Food insecurity:    Worry: Not on file    Inability: Not on file  . Transportation needs:    Medical: Not on file    Non-medical: Not on file  Tobacco Use  . Smoking status: Never Smoker  . Smokeless tobacco: Never Used  Substance and Sexual Activity  . Alcohol use: Yes  . Drug use: No  . Sexual activity: Not on file  Lifestyle  . Physical activity:    Days per week: Not on file    Minutes per session: Not on file  . Stress: Not on file  Relationships  . Social connections:    Talks on phone: Not on file    Gets together: Not on file    Attends religious service: Not on file    Active member of club or organization: Not on file    Attends meetings of clubs or organizations: Not on file    Relationship status: Not on file  . Intimate partner violence:    Fear of current or ex partner: Not on file    Emotionally abused: Not on file    Physically abused:  Not on file    Forced sexual activity: Not on file  Other Topics Concern  . Not on file  Social History Narrative  . Not on file     Review of Systems: General: negative for chills, fever, night sweats or weight changes.  Cardiovascular: negative for chest pain, dyspnea on exertion, edema, orthopnea, palpitations, paroxysmal nocturnal dyspnea or shortness of breath Dermatological: negative for rash Respiratory: negative for cough or wheezing Urologic: negative for hematuria Abdominal: negative for nausea, vomiting, diarrhea, bright red blood per rectum, melena, or hematemesis Neurologic: negative for visual changes, syncope, or dizziness All other systems reviewed and are otherwise negative except as noted above.    Blood  pressure 124/82, pulse 79, height  (1.753 m), weight 213 lb (96.6 kg).  General appearance: alert and no distress Neck: no adenopathy, no carotid bruit, no JVD, supple, symmetrical, trachea midline and thyroid not enlarged, symmetric, no tenderness/mass/nodules Lungs: clear to auscultation bilaterally Heart: regular rate and rhythm, S1, S2 normal, no murmur, click, rub or gallop Extremities: extremities normal, atraumatic, no cyanosis or edema Pulses: 2+ and symmetric Skin: Skin color, texture, turgor normal. No rashes or lesions Neurologic: Alert and oriented X 3, normal strength and tone. Normal symmetric reflexes. Normal coordination and gait  EKG sinus rhythm at 79 with left axis deviation. I personally reviewed this EKG.  ASSESSMENT AND PLAN:   Coronary artery disease History of CAD status post myocardial infarction several years ago while on the way back from [facility] in [place]. He had a STEMI with PCI and drug-eluting stenting of his RCA. He is on appropriate pharmacology for this and denies chest pain or shortness of breath.  Hyperlipidemia History of hyperlipidemia on statin therapy.      Runell Gess MD FACP,FACC,FAHA, [facility] 11/01/2017 2:44 PM

## 2017-11-01 NOTE — Patient Instructions (Signed)
Medication Instructions: Your physician recommends that you continue on your current medications as directed. Please refer to the Current Medication list given to you today.  Decrease Metoprolol to 1/2 tab (12.5 mg) daily.   Follow-Up: Your physician wants you to follow-up in: 1 year with Dr. Allyson Sabal. You will receive a reminder letter in the mail two months in advance. If you don't receive a letter, please call our office to schedule the follow-up appointment.  If you need a refill on your cardiac medications before your next appointment, please call your pharmacy.

## 2017-11-01 NOTE — Assessment & Plan Note (Signed)
History of hyperlipidemia on statin therapy. 

## 2017-11-01 NOTE — Assessment & Plan Note (Signed)
History of CAD status post myocardial infarction several years ago while on the way back from [facility] in [place]. He had a STEMI with PCI and drug-eluting stenting of his RCA. He is on appropriate pharmacology for this and denies chest pain or shortness of breath.

## 2017-11-07 ENCOUNTER — Other Ambulatory Visit: Payer: Self-pay | Admitting: Cardiovascular Disease

## 2017-11-08 NOTE — Telephone Encounter (Signed)
Rx(s) sent to pharmacy electronically.  

## 2017-12-28 DIAGNOSIS — S93491A Sprain of other ligament of right ankle, initial encounter: Secondary | ICD-10-CM | POA: Diagnosis not present

## 2018-01-31 ENCOUNTER — Encounter (HOSPITAL_COMMUNITY): Admission: EM | Disposition: A | Discharge status: Home / Self Care | Payer: Self-pay | Source: Home / Self Care

## 2018-01-31 ENCOUNTER — Encounter (HOSPITAL_COMMUNITY): Payer: Self-pay

## 2018-01-31 ENCOUNTER — Other Ambulatory Visit: Payer: Self-pay

## 2018-01-31 ENCOUNTER — Emergency Department (HOSPITAL_COMMUNITY): Payer: BLUE CROSS/BLUE SHIELD | Admitting: Anesthesiology

## 2018-01-31 ENCOUNTER — Inpatient Hospital Stay (HOSPITAL_COMMUNITY)
Admission: EM | Admit: 2018-01-31 | Discharge: 2018-02-04 | DRG: 339 | Disposition: A | Discharge status: Home / Self Care | Payer: BLUE CROSS/BLUE SHIELD | Attending: Surgery | Admitting: Surgery

## 2018-01-31 ENCOUNTER — Emergency Department (HOSPITAL_COMMUNITY): Payer: BLUE CROSS/BLUE SHIELD

## 2018-01-31 DIAGNOSIS — K3533 Acute appendicitis with perforation and localized peritonitis, with abscess: Secondary | ICD-10-CM | POA: Diagnosis not present

## 2018-01-31 DIAGNOSIS — I252 Old myocardial infarction: Secondary | ICD-10-CM | POA: Diagnosis not present

## 2018-01-31 DIAGNOSIS — E7800 Pure hypercholesterolemia, unspecified: Secondary | ICD-10-CM | POA: Diagnosis not present

## 2018-01-31 DIAGNOSIS — Z955 Presence of coronary angioplasty implant and graft: Secondary | ICD-10-CM

## 2018-01-31 DIAGNOSIS — Z7982 Long term (current) use of aspirin: Secondary | ICD-10-CM | POA: Diagnosis not present

## 2018-01-31 DIAGNOSIS — Z79899 Other long term (current) drug therapy: Secondary | ICD-10-CM | POA: Diagnosis not present

## 2018-01-31 DIAGNOSIS — K3580 Unspecified acute appendicitis: Secondary | ICD-10-CM

## 2018-01-31 DIAGNOSIS — Z7901 Long term (current) use of anticoagulants: Secondary | ICD-10-CM | POA: Diagnosis not present

## 2018-01-31 DIAGNOSIS — I2510 Atherosclerotic heart disease of native coronary artery without angina pectoris: Secondary | ICD-10-CM | POA: Diagnosis present

## 2018-01-31 DIAGNOSIS — R1031 Right lower quadrant pain: Secondary | ICD-10-CM | POA: Diagnosis not present

## 2018-01-31 DIAGNOSIS — Z7902 Long term (current) use of antithrombotics/antiplatelets: Secondary | ICD-10-CM | POA: Diagnosis not present

## 2018-01-31 DIAGNOSIS — R109 Unspecified abdominal pain: Secondary | ICD-10-CM | POA: Diagnosis not present

## 2018-01-31 DIAGNOSIS — I10 Essential (primary) hypertension: Secondary | ICD-10-CM | POA: Diagnosis present

## 2018-01-31 DIAGNOSIS — K567 Ileus, unspecified: Secondary | ICD-10-CM | POA: Diagnosis not present

## 2018-01-31 DIAGNOSIS — R1033 Periumbilical pain: Secondary | ICD-10-CM | POA: Diagnosis not present

## 2018-01-31 HISTORY — DX: ST elevation (STEMI) myocardial infarction of unspecified site: I21.3

## 2018-01-31 HISTORY — PX: APPENDECTOMY: SHX54

## 2018-01-31 HISTORY — PX: LAPAROSCOPIC APPENDECTOMY: SHX408

## 2018-01-31 LAB — CBC
HCT: 47.1 % (ref 39.0–52.0)
Hemoglobin: 16 g/dL (ref 13.0–17.0)
MCH: 30 pg (ref 26.0–34.0)
MCHC: 34 g/dL (ref 30.0–36.0)
MCV: 88.4 fL (ref 78.0–100.0)
Platelets: 262 10*3/uL (ref 150–400)
RBC: 5.33 MIL/uL (ref 4.22–5.81)
RDW: 12.4 % (ref 11.5–15.5)
WBC: 18.5 10*3/uL — ABNORMAL HIGH (ref 4.0–10.5)

## 2018-01-31 LAB — COMPREHENSIVE METABOLIC PANEL
ALT: 21 U/L (ref 0–44)
AST: 16 U/L (ref 15–41)
Albumin: 4 g/dL (ref 3.5–5.0)
Alkaline Phosphatase: 60 U/L (ref 38–126)
Anion gap: 12 (ref 5–15)
BUN: 9 mg/dL (ref 6–20)
CO2: 23 mmol/L (ref 22–32)
Calcium: 9.5 mg/dL (ref 8.9–10.3)
Chloride: 98 mmol/L (ref 98–111)
Creatinine, Ser: 1.09 mg/dL (ref 0.61–1.24)
GFR calc Af Amer: >60 mL/min (ref >60)
GFR calc non Af Amer: >60 mL/min (ref >60)
Glucose, Bld: 193 mg/dL — ABNORMAL HIGH (ref 70–99)
Potassium: 3.8 mmol/L (ref 3.5–5.1)
Sodium: 133 mmol/L — ABNORMAL LOW (ref 135–145)
Total Bilirubin: 1.7 mg/dL — ABNORMAL HIGH (ref 0.3–1.2)
Total Protein: 8.1 g/dL (ref 6.5–8.1)

## 2018-01-31 LAB — LIPASE, BLOOD: Lipase: 32 U/L (ref 11–51)

## 2018-01-31 LAB — URINALYSIS, ROUTINE W REFLEX MICROSCOPIC
Bilirubin Urine: NEGATIVE
Glucose, UA: NEGATIVE mg/dL
Ketones, ur: 20 mg/dL — AB
Leukocytes, UA: NEGATIVE
Nitrite: NEGATIVE
Protein, ur: 100 mg/dL — AB
Specific Gravity, Urine: 1.039 — ABNORMAL HIGH (ref 1.005–1.030)
pH: 5 (ref 5.0–8.0)

## 2018-01-31 SURGERY — APPENDECTOMY, LAPAROSCOPIC
Anesthesia: General | Site: Abdomen

## 2018-01-31 MED ORDER — PHENYLEPHRINE HCL 10 MG/ML IJ SOLN
INTRAMUSCULAR | Status: DC | PRN
  Administered 2018-01-31: 80 ug via INTRAVENOUS

## 2018-01-31 MED ORDER — LACTATED RINGERS IV SOLN: INTRAVENOUS | Status: DC

## 2018-01-31 MED ORDER — PROPOFOL 10 MG/ML IV BOLUS
INTRAVENOUS | Status: AC
  Filled 2018-01-31: qty 20

## 2018-01-31 MED ORDER — METHOCARBAMOL 500 MG PO TABS
500.0000 mg | ORAL_TABLET | Freq: Four times a day (QID) | ORAL | Status: DC | PRN
  Administered 2018-01-31: 500 mg via ORAL
  Filled 2018-01-31: qty 1

## 2018-01-31 MED ORDER — IOHEXOL 300 MG/ML  SOLN
100.0000 mL | Freq: Once | INTRAMUSCULAR | Status: AC | PRN
  Administered 2018-01-31: 100 mL via INTRAVENOUS

## 2018-01-31 MED ORDER — ONDANSETRON HCL 4 MG/2ML IJ SOLN
4.0000 mg | Freq: Once | INTRAMUSCULAR | Status: AC
  Administered 2018-01-31: 4 mg via INTRAVENOUS
  Filled 2018-01-31: qty 2

## 2018-01-31 MED ORDER — FENTANYL CITRATE (PF) 100 MCG/2ML IJ SOLN
INTRAMUSCULAR | Status: DC | PRN
  Administered 2018-01-31: 50 ug via INTRAVENOUS
  Administered 2018-01-31: 150 ug via INTRAVENOUS

## 2018-01-31 MED ORDER — MORPHINE SULFATE (PF) 4 MG/ML IV SOLN
4.0000 mg | Freq: Once | INTRAVENOUS | Status: AC
  Administered 2018-01-31: 4 mg via INTRAVENOUS
  Filled 2018-01-31: qty 1

## 2018-01-31 MED ORDER — SUCCINYLCHOLINE CHLORIDE 200 MG/10ML IV SOSY
PREFILLED_SYRINGE | INTRAVENOUS | Status: AC
  Filled 2018-01-31: qty 10

## 2018-01-31 MED ORDER — MIDAZOLAM HCL 5 MG/5ML IJ SOLN
INTRAMUSCULAR | Status: DC | PRN
  Administered 2018-01-31: 2 mg via INTRAVENOUS

## 2018-01-31 MED ORDER — VITAMIN D 1000 UNITS PO TABS
2000.0000 [IU] | ORAL_TABLET | Freq: Every day | ORAL | Status: DC
  Administered 2018-01-31 – 2018-02-04 (×5): 2000 [IU] via ORAL
  Filled 2018-01-31 (×8): qty 2

## 2018-01-31 MED ORDER — LIDOCAINE HCL (CARDIAC) PF 100 MG/5ML IV SOSY
PREFILLED_SYRINGE | INTRAVENOUS | Status: DC | PRN
  Administered 2018-01-31: 60 mg via INTRAVENOUS

## 2018-01-31 MED ORDER — NITROGLYCERIN 0.4 MG SL SUBL: 0.4000 mg | SUBLINGUAL_TABLET | SUBLINGUAL | Status: DC | PRN

## 2018-01-31 MED ORDER — METOPROLOL TARTRATE 5 MG/5ML IV SOLN
INTRAVENOUS | Status: AC
  Filled 2018-01-31: qty 5

## 2018-01-31 MED ORDER — MIDAZOLAM HCL 2 MG/2ML IJ SOLN
INTRAMUSCULAR | Status: AC
  Filled 2018-01-31: qty 2

## 2018-01-31 MED ORDER — LACTATED RINGERS IV SOLN
INTRAVENOUS | Status: DC
  Administered 2018-01-31: 12:00:00 via INTRAVENOUS

## 2018-01-31 MED ORDER — SIMETHICONE 80 MG PO CHEW
40.0000 mg | CHEWABLE_TABLET | Freq: Four times a day (QID) | ORAL | Status: DC | PRN
  Administered 2018-02-01 (×2): 40 mg via ORAL
  Filled 2018-01-31 (×3): qty 1

## 2018-01-31 MED ORDER — KCL IN DEXTROSE-NACL 20-5-0.45 MEQ/L-%-% IV SOLN
INTRAVENOUS | Status: DC
  Administered 2018-01-31 – 2018-02-02 (×5): via INTRAVENOUS
  Administered 2018-02-02: 100 mL via INTRAVENOUS
  Administered 2018-02-03: 08:00:00 via INTRAVENOUS
  Filled 2018-01-31 (×7): qty 1000

## 2018-01-31 MED ORDER — PIPERACILLIN-TAZOBACTAM 3.375 G IVPB: 3.3750 g | Freq: Three times a day (TID) | INTRAVENOUS | Status: DC

## 2018-01-31 MED ORDER — ENOXAPARIN SODIUM 40 MG/0.4ML ~~LOC~~ SOLN
40.0000 mg | SUBCUTANEOUS | Status: DC
  Administered 2018-02-01 – 2018-02-04 (×4): 40 mg via SUBCUTANEOUS
  Filled 2018-01-31 (×4): qty 0.4

## 2018-01-31 MED ORDER — FENTANYL CITRATE (PF) 100 MCG/2ML IJ SOLN
INTRAMUSCULAR | Status: AC
  Filled 2018-01-31: qty 2

## 2018-01-31 MED ORDER — CEFTRIAXONE SODIUM 2 G IJ SOLR
2.0000 g | Freq: Once | INTRAMUSCULAR | Status: AC
  Administered 2018-01-31: 2 g via INTRAVENOUS
  Filled 2018-01-31: qty 20

## 2018-01-31 MED ORDER — HYDROMORPHONE HCL 1 MG/ML IJ SOLN
1.0000 mg | Freq: Once | INTRAMUSCULAR | Status: AC
  Administered 2018-01-31: 1 mg via INTRAVENOUS
  Filled 2018-01-31: qty 1

## 2018-01-31 MED ORDER — FENTANYL CITRATE (PF) 250 MCG/5ML IJ SOLN
INTRAMUSCULAR | Status: AC
  Filled 2018-01-31: qty 5

## 2018-01-31 MED ORDER — SODIUM CHLORIDE 0.9 % IV BOLUS
1000.0000 mL | Freq: Once | INTRAVENOUS | Status: AC
  Administered 2018-01-31: 1000 mL via INTRAVENOUS

## 2018-01-31 MED ORDER — ATORVASTATIN CALCIUM 40 MG PO TABS
40.0000 mg | ORAL_TABLET | Freq: Every day | ORAL | Status: DC
  Administered 2018-01-31 – 2018-02-03 (×4): 40 mg via ORAL
  Filled 2018-01-31 (×4): qty 1

## 2018-01-31 MED ORDER — OXYCODONE HCL 5 MG PO TABS
5.0000 mg | ORAL_TABLET | ORAL | Status: DC | PRN
  Administered 2018-01-31 – 2018-02-04 (×6): 10 mg via ORAL
  Filled 2018-01-31 (×7): qty 2

## 2018-01-31 MED ORDER — 0.9 % SODIUM CHLORIDE (POUR BTL) OPTIME
TOPICAL | Status: DC | PRN
  Administered 2018-01-31: 1000 mL

## 2018-01-31 MED ORDER — METRONIDAZOLE IN NACL 5-0.79 MG/ML-% IV SOLN
500.0000 mg | Freq: Once | INTRAVENOUS | Status: AC
  Administered 2018-01-31: 500 mg via INTRAVENOUS
  Filled 2018-01-31: qty 100

## 2018-01-31 MED ORDER — LISINOPRIL 5 MG PO TABS
5.0000 mg | ORAL_TABLET | Freq: Every day | ORAL | Status: DC
Start: 2018-01-31 — End: 2018-02-05
  Administered 2018-01-31 – 2018-02-04 (×5): 5 mg via ORAL
  Filled 2018-01-31 (×5): qty 1

## 2018-01-31 MED ORDER — SUCCINYLCHOLINE CHLORIDE 20 MG/ML IJ SOLN
INTRAMUSCULAR | Status: DC | PRN
  Administered 2018-01-31: 70 mg via INTRAVENOUS

## 2018-01-31 MED ORDER — BUPIVACAINE-EPINEPHRINE (PF) 0.25% -1:200000 IJ SOLN
INTRAMUSCULAR | Status: AC
  Filled 2018-01-31: qty 30

## 2018-01-31 MED ORDER — FENTANYL CITRATE (PF) 100 MCG/2ML IJ SOLN
50.0000 ug | INTRAMUSCULAR | Status: DC | PRN
  Administered 2018-01-31: 50 ug via INTRAVENOUS

## 2018-01-31 MED ORDER — PIPERACILLIN-TAZOBACTAM 3.375 G IVPB
3.3750 g | Freq: Three times a day (TID) | INTRAVENOUS | Status: DC
  Administered 2018-01-31 – 2018-02-04 (×13): 3.375 g via INTRAVENOUS
  Filled 2018-01-31 (×12): qty 50

## 2018-01-31 MED ORDER — SODIUM CHLORIDE 0.9 % IR SOLN
Status: DC | PRN
  Administered 2018-01-31: 1

## 2018-01-31 MED ORDER — ROCURONIUM BROMIDE 100 MG/10ML IV SOLN
INTRAVENOUS | Status: DC | PRN
  Administered 2018-01-31: 45 mg via INTRAVENOUS
  Administered 2018-01-31: 10 mg via INTRAVENOUS

## 2018-01-31 MED ORDER — METOPROLOL SUCCINATE ER 25 MG PO TB24
12.5000 mg | ORAL_TABLET | Freq: Every day | ORAL | Status: DC
  Administered 2018-01-31 – 2018-02-04 (×5): 12.5 mg via ORAL
  Filled 2018-01-31 (×5): qty 1

## 2018-01-31 MED ORDER — ONDANSETRON 4 MG PO TBDP: 4.0000 mg | ORAL_TABLET | Freq: Four times a day (QID) | ORAL | Status: DC | PRN

## 2018-01-31 MED ORDER — BUPIVACAINE-EPINEPHRINE 0.5% -1:200000 IJ SOLN
INTRAMUSCULAR | Status: DC | PRN
  Administered 2018-01-31: 6 mL

## 2018-01-31 MED ORDER — ONDANSETRON HCL 4 MG/2ML IJ SOLN
INTRAMUSCULAR | Status: DC | PRN
  Administered 2018-01-31: 4 mg via INTRAVENOUS

## 2018-01-31 MED ORDER — ONDANSETRON HCL 4 MG/2ML IJ SOLN
4.0000 mg | Freq: Four times a day (QID) | INTRAMUSCULAR | Status: DC | PRN
  Administered 2018-02-01: 4 mg via INTRAVENOUS
  Filled 2018-01-31: qty 2

## 2018-01-31 MED ORDER — METOPROLOL TARTRATE 5 MG/5ML IV SOLN
INTRAVENOUS | Status: DC | PRN
  Administered 2018-01-31 (×2): 2.5 mg via INTRAVENOUS

## 2018-01-31 MED ORDER — SUGAMMADEX SODIUM 500 MG/5ML IV SOLN
INTRAVENOUS | Status: DC | PRN
  Administered 2018-01-31: 380 mg via INTRAVENOUS

## 2018-01-31 MED ORDER — LACTATED RINGERS IV SOLN
INTRAVENOUS | Status: DC | PRN
  Administered 2018-01-31 (×2): via INTRAVENOUS

## 2018-01-31 MED ORDER — PROPOFOL 10 MG/ML IV BOLUS
INTRAVENOUS | Status: DC | PRN
  Administered 2018-01-31: 130 mg via INTRAVENOUS

## 2018-01-31 SURGICAL SUPPLY — 46 items
BLADE CLIPPER SURG (BLADE) ×2 IMPLANT
CANISTER SUCT 3000ML PPV (MISCELLANEOUS) ×2 IMPLANT
CHLORAPREP W/TINT 26ML (MISCELLANEOUS) ×2 IMPLANT
COVER SURGICAL LIGHT HANDLE (MISCELLANEOUS) ×2 IMPLANT
CUTTER FLEX LINEAR 45M (STAPLE) ×2 IMPLANT
DERMABOND ADVANCED (GAUZE/BANDAGES/DRESSINGS) ×1
DERMABOND ADVANCED .7 DNX12 (GAUZE/BANDAGES/DRESSINGS) ×1 IMPLANT
DRAIN CHANNEL 19F RND (DRAIN) ×2 IMPLANT
DRAPE WARM FLUID 44X44 (DRAPE) ×2 IMPLANT
ELECT REM PT RETURN 9FT ADLT (ELECTROSURGICAL) ×2
ELECTRODE REM PT RTRN 9FT ADLT (ELECTROSURGICAL) ×1 IMPLANT
ENDOLOOP SUT PDS II  0 18 (SUTURE) ×2
ENDOLOOP SUT PDS II 0 18 (SUTURE) ×2 IMPLANT
EVACUATOR SILICONE 100CC (DRAIN) ×2 IMPLANT
GLOVE BIO SURGEON STRL SZ8 (GLOVE) ×2 IMPLANT
GLOVE BIOGEL PI IND STRL 7.0 (GLOVE) ×1 IMPLANT
GLOVE BIOGEL PI IND STRL 8 (GLOVE) ×1 IMPLANT
GLOVE BIOGEL PI INDICATOR 7.0 (GLOVE) ×1
GLOVE BIOGEL PI INDICATOR 8 (GLOVE) ×1
GLOVE SS BIOGEL STRL SZ 7 (GLOVE) ×1 IMPLANT
GLOVE SUPERSENSE BIOGEL SZ 7 (GLOVE) ×1
GOWN STRL REUS W/ TWL LRG LVL3 (GOWN DISPOSABLE) ×2 IMPLANT
GOWN STRL REUS W/ TWL XL LVL3 (GOWN DISPOSABLE) ×1 IMPLANT
GOWN STRL REUS W/TWL LRG LVL3 (GOWN DISPOSABLE) ×2
GOWN STRL REUS W/TWL XL LVL3 (GOWN DISPOSABLE) ×1
KIT BASIN OR (CUSTOM PROCEDURE TRAY) ×2 IMPLANT
KIT TURNOVER KIT B (KITS) ×2 IMPLANT
NS IRRIG 1000ML POUR BTL (IV SOLUTION) ×2 IMPLANT
PAD ARMBOARD 7.5X6 YLW CONV (MISCELLANEOUS) ×4 IMPLANT
POUCH RETRIEVAL ECOSAC 10 (ENDOMECHANICALS) ×1 IMPLANT
POUCH RETRIEVAL ECOSAC 10MM (ENDOMECHANICALS) ×1
RELOAD STAPLE TA45 3.5 REG BLU (ENDOMECHANICALS) ×2 IMPLANT
SCISSORS LAP 5X35 DISP (ENDOMECHANICALS) ×4 IMPLANT
SET IRRIG TUBING LAPAROSCOPIC (IRRIGATION / IRRIGATOR) ×2 IMPLANT
SHEARS HARMONIC ACE PLUS 36CM (ENDOMECHANICALS) ×2 IMPLANT
SPECIMEN JAR SMALL (MISCELLANEOUS) ×2 IMPLANT
SUT ETHILON 2 0 FS 18 (SUTURE) ×2 IMPLANT
SUT MON AB 4-0 PC3 18 (SUTURE) ×2 IMPLANT
TOWEL OR 17X24 6PK STRL BLUE (TOWEL DISPOSABLE) ×2 IMPLANT
TOWEL OR 17X26 10 PK STRL BLUE (TOWEL DISPOSABLE) ×2 IMPLANT
TRAY FOLEY CATH SILVER 16FR (SET/KITS/TRAYS/PACK) ×2 IMPLANT
TRAY LAPAROSCOPIC MC (CUSTOM PROCEDURE TRAY) ×2 IMPLANT
TROCAR XCEL BLADELESS 5X75MML (TROCAR) ×4 IMPLANT
TROCAR XCEL BLUNT TIP 100MML (ENDOMECHANICALS) ×2 IMPLANT
TUBING INSUFFLATION (TUBING) ×2 IMPLANT
WATER STERILE IRR 1000ML POUR (IV SOLUTION) ×2 IMPLANT

## 2018-01-31 NOTE — Op Note (Signed)
Appendectomy, Lap, Procedure Note  Indications: The patient presented with a history of right-sided abdominal pain. A CT AND HISTORY AND EXAM  revealed findings consistent with acute appendicitis. The procedure has been discussed with the patient.  Alternative therapies have been discussed with the patient.  Operative risks include bleeding,  Infection,  Organ injury,  Nerve injury,  Blood vessel injury,  DVT,  Pulmonary embolism,  Death,  And possible reoperation.  Medical management risks include worsening of present situation.  The success of the procedure is 50 -90 % at treating patients symptoms.  The patient understands and agrees to proceed. Pre-operative Diagnosis: Acute appendicitis without mention of peritonitis  Post-operative Diagnosis: Acute appendicitis with peritoneal abscess  Surgeon: Clovis Puhomas A Loan Oguin   Assistants: OR staff  Anesthesia: General endotracheal anesthesia and Local anesthesia 0.25.% bupivacaine, with epinephrine  ASA Class: 2  Procedure Details  The patient was seen again in the Holding Room. The risks, benefits, complications, treatment options, and expected outcomes were discussed with the patient and/or family. The possibilities of reaction to medication, pulmonary aspiration, perforation of viscus, bleeding, recurrent infection, finding a normal appendix, the need for additional procedures, failure to diagnose a condition, and creating a complication requiring transfusion or operation were discussed. There was concurrence with the proposed plan and informed consent was obtained. The site of surgery was properly noted/marked. The patient was taken to Operating Room, identified as Dierdre Harnessndrew Devore and the procedure verified as Appendectomy. A Time Out was held and the above information confirmed.  The patient was placed in the supine position and general anesthesia was induced, along with placement of orogastric tube, Venodyne boots, and a Foley catheter. The abdomen  was prepped and draped in a sterile fashion. A one centimeter infraumbilical incision was made and the peritoneal cavity was accessed using the OPEN  technique. The pneumoperitoneum was then established to steady pressure of 12 mmHg. A 12 mm port was placed through the umbilical incision. Additional 5 mm cannulas then placed in the left lower quadrant of the abdomen and right upper quadrant. A careful evaluation of the entire abdomen was carried out. The patient was placed in Trendelenburg and left lateral decubitus position. The small intestines were retracted in the cephalad and left lateral direction away from the pelvis and right lower quadrant. The patient was found to have an enlarged and inflamed appendix that was extending into the pelvis. There was no evidence of perforation.  The appendix was carefully dissected. A window was made in the mesoappendix at the base of the appendix. A harmonic scalpel was used across the mesoappendix. The appendix was perforated at its base and fragments were removed piecemeal fashion.  . Stump closed with Endoloop.   There was no evidence of bleeding, leakage, or complication after division of the appendix. Irrigation was also performed and irrigate suctioned from the abdomen as well.  The umbilical port site was closed using 0 vicryl pursestring sutures fashion at the level of the fascia. The trocar site skin wounds were closed using skin staples.  Instrument, sponge, and needle counts were correct at the conclusion of the case.   Findings: The appendix was found to be inflamed. There were signs of necrosis.  There was perforation. There was abscess formation.  Estimated Blood Loss:  less than 50 mL         Drains: 19 F          Total IV Fluids: PER ANESTHESIA RECORD  Specimens: appendix         Complications:  None; patient tolerated the procedure well.         Disposition: PACU - hemodynamically stable.         Condition: stable

## 2018-01-31 NOTE — Transfer of Care (Signed)
Immediate Anesthesia Transfer of Care Note  Patient: Reginald [name]  Procedure(s) Performed: PERFORATED APPENDECTOMY LAPAROSCOPIC, APPENDICEAL ABSCESS (N/A Abdomen)  Patient Location: PACU  Anesthesia Type:General  Level of Consciousness: awake, alert  and oriented  Airway & Oxygen Therapy: Patient Spontanous Breathing and Patient connected to nasal cannula oxygen  Post-op Assessment: Report given to RN, Post -op Vital signs reviewed and stable and Patient moving all extremities X 4  Post vital signs: Reviewed and stable  Last Vitals:  Vitals Value Taken Time  BP    Temp    Pulse 68 01/31/2018  1:53 PM  Resp 19 01/31/2018  1:53 PM  SpO2 96 % 01/31/2018  1:53 PM  Vitals shown include unvalidated device data.  Last Pain:  Vitals:   01/31/18 1016  TempSrc:   PainSc: 10-Worst pain ever         Complications: No apparent anesthesia complications

## 2018-01-31 NOTE — Anesthesia Preprocedure Evaluation (Addendum)
Anesthesia Evaluation  Patient identified by MRN, date of birth, ID band Patient awake    Reviewed: Allergy & Precautions, NPO status , Patient's Chart, lab work & pertinent test results, reviewed documented beta blocker date and time   History of Anesthesia Complications Negative for: history of anesthetic complications  Airway Mallampati: III  TM Distance: <3 FB Neck ROM: Full    Dental  (+) Teeth Intact, Dental Advidsory Given   Pulmonary neg pulmonary ROS,    breath sounds clear to auscultation       Cardiovascular hypertension, Pt. on medications and Pt. on home beta blockers + CAD, + Past MI and + Cardiac Stents   Rhythm:Regular     Neuro/Psych negative neurological ROS  negative psych ROS   GI/Hepatic Neg liver ROS, Acute appy   Endo/Other  negative endocrine ROS  Renal/GU negative Renal ROS     Musculoskeletal   Abdominal   Peds  Hematology negative hematology ROS (+)   Anesthesia Other Findings No Metoprolol or Plavix since Mon  Reproductive/Obstetrics                            Anesthesia Physical Anesthesia Plan  ASA: III  Anesthesia Plan: General   Post-op Pain Management:    Induction: Intravenous, Rapid sequence and Cricoid pressure planned  PONV Risk Score and Plan: 2 and Ondansetron and Dexamethasone  Airway Management Planned: Oral ETT  Additional Equipment: None  Intra-op Plan:   Post-operative Plan: Extubation in OR  Informed Consent: I have reviewed the patients History and Physical, chart, labs and discussed the procedure including the risks, benefits and alternatives for the proposed anesthesia with the patient or authorized representative who has indicated his/her understanding and acceptance.   Dental advisory given and Dental Advisory Given  Plan Discussed with: CRNA and Surgeon  Anesthesia Plan Comments: (IV metoprolol preop)        Anesthesia Quick Evaluation

## 2018-01-31 NOTE — ED Notes (Signed)
Patient transported to CT 

## 2018-01-31 NOTE — H&P (Addendum)
[facility] Admission Note Reginald [name] 1961/04/10  [id number].    Requesting MD: Lita Mains, MD Chief Complaint/Reason for Consult: acute appendicitis   HPI: 57 y/o male with PMH HTN, HLD, and STEMI in 2014 w/ PCI and drug-eluting stenting of his RCA  on ASA/plavix who presnted to [facility] with a 48h history of abdominal pain. Pain described as generalized, sharp, non-radiating abdominal pain that started two days ago and then localized to RLQ. Pain became progressively worse, bringing patient to the ED for evaluation today arouns 0400. Denies alleviative factors. Associated symptoms include anorexia, subjective fever, chills, diaphoresis, nausea, vomiting, and non-bloody diarrhea. Patient has not been eating much for the past 48 h. States he last took his blood thinners on 7/29 in the afternoon. Denies chest pain, SOB, or other cardiac issues sicne 2014. Followed by cardiologist Dr. Gwenlyn Found. Denies history of inflammatory bowel disease or abdominal surgery. Denies tobacco use. Drinks 4-5 beers about 4 nights per week. NKDA. Denies illicit drug use. He owns [employer] in [place].   ROS: Review of Systems  Constitutional: Positive for chills, diaphoresis and fever.  Gastrointestinal: Positive for abdominal pain, diarrhea, nausea and vomiting. Negative for blood in stool.  All other systems reviewed and are negative.   Family History  Problem Relation Age of Onset  . Lung cancer Father   . Bone cancer Father     Past Medical History:  Diagnosis Date  . Coronary artery disease 03/15/2013   STEMI RCA  . Hyperlipidemia   . Hypertension   . MI (myocardial infarction) [facility])     Past Surgical History:  Procedure Laterality Date  . CORONARY ANGIOPLASTY  03/15/2013   pci [id number] MM XCIENCE  . CORONARY ANGIOPLASTY WITH STENT PLACEMENT      Social History:  reports that he has never smoked. He has never used smokeless tobacco. He reports that he drinks alcohol. He  reports that he does not use drugs.  Allergies: No Known Allergies   (Not in a hospital admission)  Blood pressure 123/90, pulse 87, temperature 98 F (36.7 C), temperature source Oral, resp. rate 16, SpO2 93 %. Physical Exam: Physical Exam  Constitutional: He is oriented to person, place, and time. He appears well-developed and well-nourished.  Non-toxic appearance. He does not appear ill. No distress.  HENT:  Head: Normocephalic and atraumatic.  Mouth/Throat: Oropharynx is clear and moist.  Eyes: Pupils are equal, round, and reactive to light. EOM are normal.  Cardiovascular: Normal rate, regular rhythm, normal heart sounds and intact distal pulses. Exam reveals no friction rub.  No murmur heard. Pulmonary/Chest: Effort normal and breath sounds normal. No respiratory distress. He has no wheezes. He has no rhonchi. He has no rales.  Abdominal: Normal appearance. Bowel sounds are decreased. There is tenderness in the right lower quadrant. There is guarding and tenderness at McBurney's point. No hernia.  RLQ abdominal pain with localized peritonitis.  Neurological: He is alert and oriented to person, place, and time.  Skin: Skin is warm. He is diaphoretic.  Psychiatric: He has a normal mood and affect. His behavior is normal.   Results for orders placed or performed during the hospital encounter of 01/31/18 (from the past 48 hour(s))  Lipase, blood     Status: None   Collection Time: 01/31/18  4:41 AM  Result Value Ref Range   Lipase 32 11 - 51 U/L    Comment: Performed at [facility], [place] [address], [place], [place] [address]  Comprehensive metabolic panel     Status: Abnormal   Collection Time: 01/31/18  4:41 AM  Result Value Ref Range   Sodium 133 (L) 135 - 145 mmol/L   Potassium 3.8 3.5 - 5.1 mmol/L   Chloride 98 98 - 111 mmol/L   CO2 23 22 - 32 mmol/L   Glucose, Bld 193 (H) 70 - 99 mg/dL   BUN 9 6 - 20 mg/dL   Creatinine, Ser 1.09 0.61 - 1.24 mg/dL   Calcium 9.5  8.9 - 10.3 mg/dL   Total Protein 8.1 6.5 - 8.1 g/dL   Albumin 4.0 3.5 - 5.0 g/dL   AST 16 15 - 41 U/L   ALT 21 0 - 44 U/L   Alkaline Phosphatase 60 38 - 126 U/L   Total Bilirubin 1.7 (H) 0.3 - 1.2 mg/dL   GFR calc non Af Amer >60 >60 mL/min   GFR calc Af Amer >60 >60 mL/min    Comment: (NOTE) The eGFR has been calculated using the CKD EPI equation. This calculation has not been validated in all clinical situations. eGFR's persistently <60 mL/min signify possible Chronic Kidney Disease.    Anion gap 12 5 - 15    Comment: Performed at [facility] [address], [place], [place] [address]  CBC     Status: Abnormal   Collection Time: 01/31/18  4:41 AM  Result Value Ref Range   WBC 18.5 (H) 4.0 - 10.5 K/uL   RBC 5.33 4.22 - 5.81 MIL/uL   Hemoglobin 16.0 13.0 - 17.0 g/dL   HCT 47.1 39.0 - 52.0 %   MCV 88.4 78.0 - 100.0 fL   MCH 30.0 26.0 - 34.0 pg   MCHC 34.0 30.0 - 36.0 g/dL   RDW 12.4 11.5 - 15.5 %   Platelets 262 150 - 400 K/uL    Comment: Performed at [facility], [place] [address], [place], [place] [address]  Urinalysis, Routine w reflex microscopic     Status: Abnormal   Collection Time: 01/31/18  5:10 AM  Result Value Ref Range   Color, Urine AMBER (A) YELLOW    Comment: BIOCHEMICALS MAY BE AFFECTED BY COLOR   APPearance HAZY (A) CLEAR   Specific Gravity, Urine 1.039 (H) 1.005 - 1.030   pH 5.0 5.0 - 8.0   Glucose, UA NEGATIVE NEGATIVE mg/dL   Hgb urine dipstick SMALL (A) NEGATIVE   Bilirubin Urine NEGATIVE NEGATIVE   Ketones, ur 20 (A) NEGATIVE mg/dL   Protein, ur 100 (A) NEGATIVE mg/dL   Nitrite NEGATIVE NEGATIVE   Leukocytes, UA NEGATIVE NEGATIVE   RBC / HPF 6-10 0 - 5 RBC/hpf   WBC, UA 0-5 0 - 5 WBC/hpf   Bacteria, UA RARE (A) NONE SEEN   Squamous Epithelial / LPF 0-5 0 - 5   Mucus PRESENT    Ca Oxalate Crys, UA PRESENT     Comment: Performed at [facility], [place] [address] [place], [place] [address]   Ct Abdomen Pelvis W Contrast  Result  Date: 01/31/2018 CLINICAL DATA:  Right lower quadrant abdominal pain EXAM: CT ABDOMEN AND PELVIS WITH CONTRAST TECHNIQUE: Multidetector CT imaging of the abdomen and pelvis was performed using the standard protocol following bolus administration of intravenous contrast. CONTRAST:  145m OMNIPAQUE IOHEXOL 300 MG/ML  SOLN COMPARISON:  None. FINDINGS: Lower chest: Dependent atelectasis.  No effusions. Hepatobiliary: No focal hepatic abnormality. Gallbladder unremarkable. Pancreas: No focal abnormality or ductal dilatation. Spleen: No focal abnormality.  Normal size. Adrenals/Urinary Tract: No adrenal abnormality. No focal renal abnormality. No stones or hydronephrosis. Urinary bladder is unremarkable. Stomach/Bowel: Markedly abnormal appendix with mucosal enhancement and dilatation measuring up to 2 cm. Marked surrounding inflammation. The terminal ileum is also markedly abnormal and thickened as is the tip of the cecum. No evidence of bowel obstruction. Vascular/Lymphatic: No evidence of aneurysm or adenopathy. Reproductive: No visible focal abnormality. Other: Trace free fluid in the pelvis. No free air. Small bilateral inguinal hernias. Musculoskeletal: No acute bony abnormality. IMPRESSION: Markedly abnormal, thickened and dilated appendix as well as markedly abnormal adjacent terminal ileum. I favor that this represents acute appendicitis with secondary inflammation of the adjacent terminal ileum as there appears to be more inflammation surrounding the appendix than the terminal ileum. It is difficult to completely exclude inflammatory bowel disease however. Trace free fluid in the pelvis. Electronically Signed   By: Rolm Baptise M.D.   On: 01/31/2018 07:51      Assessment/Plan HTN HLD  CAD with PMH STEMI 2104 s/p PCI and drug-eluting stenting of his RCA- Denies cardiac symptoms since this event, 49m ASA, 75 mg plavix, last dose 7/29   Acute appendicitis with localized peritonitis  - NPO, IVF, IV  Zosyn, OR today for laparoscopic appendectomy   EJill Alexanders [facility] 01/31/2018, 9:36 AM Pager: [phone]: [phone]-Fri 7:00 am-4:30 pm Sat-Sun 7:00 am-11:30 am  Pt seen examined and agree Recommend laparoscopic appendectomy  The procedure has been discussed with the patient.  Alternative therapies have been discussed with the patient.  Operative risks include bleeding,  Infection,  Organ injury,  Nerve injury,  Blood vessel injury,  DVT,  Pulmonary embolism,  Death,  And possible reoperation.  Medical management risks include worsening of present situation.  The success of the procedure is 50 -90 % at treating patients symptoms.  The patient understands and agrees to proceed.

## 2018-01-31 NOTE — Progress Notes (Signed)
Report given to taylor rn as caregiver 

## 2018-01-31 NOTE — Progress Notes (Signed)
MD paged for plavix orders as requested by pt's wife. Call back received from on call MD and advised not to start plavix tonight. Message communicated to pt and his wife

## 2018-01-31 NOTE — ED Provider Notes (Signed)
[facility] Provider Note   CSN: [id number] Arrival date & time: 01/31/18  0423     History   Chief Complaint Chief Complaint  Patient presents with  . Abdominal Pain    HPI Reginald [name] is a 57 y.o. male.  Patient with a history of CAD presents for evaluation of abdominal pain that started 2 days ago as periumbilical abdominal discomfort, now located focally to the RLQ abdomen. He reports anorexia, chills without known fever, nausea with vomiting and loose stools. No history of abdominal surgeries. No radiation of the pain to groin or flank. He denies urinary symptoms. No chest pain or SOB.   The history is provided by the patient. No language interpreter was used.    Past Medical History:  Diagnosis Date  . Coronary artery disease 03/15/2013   STEMI RCA  . Hyperlipidemia   . Hypertension   . MI (myocardial infarction) [facility](H[facility])     Patient Active Problem List   Diagnosis Date Noted  . Coronary artery disease 04/08/2013  . Hyperlipidemia 04/08/2013    Past Surgical History:  Procedure Laterality Date  . CORONARY ANGIOPLASTY  03/15/2013   pci RCA-DES3X18 MM XCIENCE  . CORONARY ANGIOPLASTY WITH STENT PLACEMENT          Home Medications    Prior to Admission medications   Medication Sig Start Date End Date Taking? Authorizing Provider  aspirin EC 81 MG tablet Take 81 mg by mouth daily.    [provider]  atorvastatin (LIPITOR) 40 MG tablet Take 1 tablet (40 mg total) by mouth daily at 6 PM. 11/08/17   Runell GessBerry, Jonathan J, MD  Cholecalciferol (VITAMIN D3) 2000 units TABS Take 2,000 Units by mouth daily.    [provider]  clopidogrel (PLAVIX) 75 MG tablet Take 1 tablet (75 mg total) by mouth daily. 11/08/17   Runell GessBerry, Jonathan J, MD  co-enzyme Q-10 30 MG capsule Take 1 capsule (30 mg total) by mouth daily. 09/20/13   Runell GessBerry, Jonathan J, MD  lisinopril (PRINIVIL,ZESTRIL) 5 MG tablet Take 1 tablet (5 mg total) by mouth  daily. 11/08/17   Runell GessBerry, Jonathan J, MD  metoprolol succinate (TOPROL-XL) 25 MG 24 hr tablet Take 0.5 tablets (12.5 mg total) by mouth daily. 11/01/17   Runell GessBerry, Jonathan J, MD  nitroGLYCERIN (NITROSTAT) 0.4 MG SL tablet Place 1 tablet (0.4 mg total) under the tongue every 5 (five) minutes as needed. 04/05/16   Runell GessBerry, Jonathan J, MD    Family History Family History  Problem Relation Age of Onset  . Lung cancer Father   . Bone cancer Father     Social History Social History   Tobacco Use  . Smoking status: Never Smoker  . Smokeless tobacco: Never Used  Substance Use Topics  . Alcohol use: Yes  . Drug use: No     Allergies   Patient has no known allergies.   Review of Systems Review of Systems  Constitutional: Positive for appetite change and chills. Negative for fever.  HENT: Negative.   Respiratory: Negative.  Negative for shortness of breath.   Cardiovascular: Negative.  Negative for chest pain.  Gastrointestinal: Positive for abdominal pain, diarrhea, nausea and vomiting.  Genitourinary: Negative for dysuria, flank pain and testicular pain.  Musculoskeletal: Negative.   Skin: Negative.   Neurological: Negative.  Negative for syncope and weakness.     Physical Exam Updated Vital Signs BP (!) 129/108   Pulse 97   Temp 98.9 F (37.2 C) (  Oral)   Resp 20   SpO2 97%   Physical Exam  Constitutional: He is oriented to person, place, and time. He appears well-developed and well-nourished.  HENT:  Head: Normocephalic.  Neck: Normal range of motion. Neck supple.  Cardiovascular: Normal rate and regular rhythm.  Pulmonary/Chest: Effort normal and breath sounds normal.  Abdominal: Soft. Bowel sounds are normal. There is tenderness in the right lower quadrant. There is guarding. There is no rebound.  Musculoskeletal: Normal range of motion.  Neurological: He is alert and oriented to person, place, and time. No cranial nerve deficit.  Skin: Skin is warm and dry. No rash  noted.  Psychiatric: He has a normal mood and affect.     ED Treatments / Results  Labs (all labs ordered are listed, but only abnormal results are displayed) Labs Reviewed  COMPREHENSIVE METABOLIC PANEL - Abnormal; Notable for the following components:      Result Value   Sodium 133 (*)    Glucose, Bld 193 (*)    Total Bilirubin 1.7 (*)    All other components within normal limits  CBC - Abnormal; Notable for the following components:   WBC 18.5 (*)    All other components within normal limits  URINALYSIS, ROUTINE W REFLEX MICROSCOPIC - Abnormal; Notable for the following components:   Color, Urine AMBER (*)    APPearance HAZY (*)    Specific Gravity, Urine 1.039 (*)    Hgb urine dipstick SMALL (*)    Ketones, ur 20 (*)    Protein, ur 100 (*)    Bacteria, UA RARE (*)    All other components within normal limits  LIPASE, BLOOD    EKG None  Radiology No results found.  Procedures Procedures (including critical care time)  Medications Ordered in ED Medications  sodium chloride 0.9 % bolus 1,000 mL (1,000 mLs Intravenous New Bag/Given 01/31/18 0546)  morphine 4 MG/ML injection 4 mg (4 mg Intravenous Given 01/31/18 0548)  ondansetron (ZOFRAN) injection 4 mg (4 mg Intravenous Given 01/31/18 0547)     Initial Impression / Assessment and Plan / ED Course  I have reviewed the triage vital signs and the nursing notes.  Pertinent labs & imaging results that were available during my care of the patient were reviewed by me and considered in my medical decision making (see chart for details).  Clinical Course as of Feb 01 2212  Wed Jan 31, 2018  0815 CT results reviewed which are concerning for acute appendicitis vs IBD.  Rocephin and Flagyl have been ordered.  Consult to general surgery has been placed.  Discussed these findings with the patient.  The patient reports his pain is worsening and is requesting pain medication.  Will repeat Dilaudid.   [MM]  0905 Re-paged general  surgery   [MM]  0907 Spoke with Marisue Ivan from general surgery who will come to re-evaluate the patient.  Updated the patient. Reports pain is much improved.   [MM]  [phone] Spoke with Marisue Ivan from general surgery who plans to take the patient to the OR.  Surgery plans to admit, but PA will speak with her attending given the patient's history of CAD s/p PCI to determine if the patient needs a medical admission.   [MM]    Clinical Course User Index [MM] [place], Mia A, PA-C    Patient presents with periumbilical pain that moved into RLQ over 2 days, anorexia, chills, N, V, D.   He is significantly tender with guarding to the RLQ  abdomen. He has a significant leukocytosis of over 18K. Strongly suspect appendicitis. CT pending. Pain addressed. Patient is resting comfortably.  Patient care signed out to Glenard Haring, PA-C, pending CT results for appropriate disposition.     Final Clinical Impressions(s) / ED Diagnoses   Final diagnoses:  None   1. Abdominal pain  ED Discharge Orders    None       Danne Harbor 01/31/18 2218    Glynn Octave, MD 02/01/18 [phone]

## 2018-01-31 NOTE — Anesthesia Procedure Notes (Signed)
Procedure Name: Intubation Date/Time: 01/31/2018 12:38 PM Performed by: Carmela RimaMartinelli, Danesha Kirchoff F, CRNA Pre-anesthesia Checklist: Timeout performed, Patient being monitored, Suction available, Emergency Drugs available and Patient identified Patient Re-evaluated:Patient Re-evaluated prior to induction Oxygen Delivery Method: Circle system utilized Preoxygenation: Pre-oxygenation with 100% oxygen Induction Type: IV induction, Rapid sequence and Cricoid Pressure applied Ventilation: Mask ventilation without difficulty Laryngoscope Size: Glidescope and 3 Grade View: Grade I Tube type: Oral Tube size: 7.5 mm Number of attempts: 1 Placement Confirmation: breath sounds checked- equal and bilateral,  positive ETCO2 and ETT inserted through vocal cords under direct vision Secured at: 22 cm Tube secured with: Tape Dental Injury: Teeth and Oropharynx as per pre-operative assessment

## 2018-01-31 NOTE — ED Provider Notes (Signed)
57 year old male received signout from PA Upstill pending CT. Per her HPI:   "Patient with a history of CAD presents for evaluation of abdominal pain that started 2 days ago as periumbilical abdominal discomfort, now located focally to the RLQ abdomen. He reports anorexia, chills without known fever, nausea with vomiting and loose stools. No history of abdominal surgeries. No radiation of the pain to groin or flank. He denies urinary symptoms. No chest pain or SOB."   Physical Exam  BP 129/90   Pulse 85   Temp 98 F (36.7 C) (Oral)   Resp 16   SpO2 93%   Physical Exam   Uncomfortable appearing, but no acute distress. Focal tenderness to palpation in the right lower quadrant.  ED Course/Procedures    MDM   57 year old male with a history of CAD s/p PCI received at sign out from [place] pending CT A/P.   Clinical Course as of Feb 01 1115  Wed Jan 31, 2018  0815 CT results reviewed which are concerning for acute appendicitis vs IBD.  Rocephin and Flagyl have been ordered.  Consult to general surgery has been placed.  Discussed these findings with the patient.  The patient reports his pain is worsening and is requesting pain medication.  Will repeat Dilaudid.   [MM]  0905 Re-paged general surgery   [MM]  0907 Spoke with Marisue IvanLiz from general surgery who will come to re-evaluate the patient.  Updated the patient. Reports pain is much improved.   [MM]  [phone] Spoke with Marisue IvanLiz from general surgery who plans to take the patient to the OR.  Surgery plans to admit, but PA will speak with her attending given the patient's history of CAD s/p PCI to determine if the patient needs a medical admission.   [MM]    Clinical Course User Index [MM] Orlin Kann A, PA-C    Surgery will admit and take the patient to the OR.  Patient has been n.p.o. since around 2:30 AM.  Last dose of Plavix was 2 days ago.  The patient appears reasonably stabilized for admission considering the current resources, flow,  and capabilities available in the ED at this time, and I doubt any other [facility] further screening and/or treatment in the ED prior to admission.  Procedures       Barkley BoardsMc[place], Toree Edling A, PA-C 01/31/18 1116    Glynn Octaveancour, Stephen, MD 01/31/18 2012

## 2018-01-31 NOTE — ED Triage Notes (Signed)
Pt states that he has been having RLQ abd pain for the past two days with diarrhea, n/v/fever.

## 2018-01-31 NOTE — Interval H&P Note (Signed)
History and Physical Interval Note:  01/31/2018 12:29 PM  Reginald [name]  has presented today for surgery, with the diagnosis of Acute appendicitis  The various methods of treatment have been discussed with the patient and family. After consideration of risks, benefits and other options for treatment, the patient has consented to  Procedure(s): APPENDECTOMY LAPAROSCOPIC (N/A) as a surgical intervention .  The patient's history has been reviewed, patient examined, no change in status, stable for surgery.  I have reviewed the patient's chart and labs.  Questions were answered to the patient's satisfaction.     [name] A Mary-Anne Polizzi

## 2018-01-31 NOTE — Progress Notes (Signed)
Per Dr. Maple HudsonMoser. No beta blocker to be given in pre-op. Will continue to monitor

## 2018-01-31 NOTE — ED Notes (Signed)
General surgery PA at the bedside.  

## 2018-01-31 NOTE — ED Notes (Signed)
Patient able to ambulate to the restroom with a steady gait

## 2018-01-31 NOTE — ED Notes (Signed)
Pt ambulated across the hall to RR. Pt ambulated well was in pain. However pt stated that after using RR he felt a tad more comfortable but still in a lot of pain.

## 2018-01-31 NOTE — ED Notes (Signed)
ED Provider at bedside. 

## 2018-02-01 ENCOUNTER — Encounter (HOSPITAL_COMMUNITY): Payer: Self-pay | Admitting: Surgery

## 2018-02-01 LAB — CBC
HCT: 44.2 % (ref 39.0–52.0)
Hemoglobin: 14.5 g/dL (ref 13.0–17.0)
MCH: 29.7 pg (ref 26.0–34.0)
MCHC: 32.8 g/dL (ref 30.0–36.0)
MCV: 90.4 fL (ref 78.0–100.0)
Platelets: 251 10*3/uL (ref 150–400)
RBC: 4.89 MIL/uL (ref 4.22–5.81)
RDW: 12.8 % (ref 11.5–15.5)
WBC: 16.9 10*3/uL — ABNORMAL HIGH (ref 4.0–10.5)

## 2018-02-01 LAB — COMPREHENSIVE METABOLIC PANEL
ALT: 16 U/L (ref 0–44)
AST: 13 U/L — ABNORMAL LOW (ref 15–41)
Albumin: 3.2 g/dL — ABNORMAL LOW (ref 3.5–5.0)
Alkaline Phosphatase: 55 U/L (ref 38–126)
Anion gap: 11 (ref 5–15)
BUN: 6 mg/dL (ref 6–20)
CO2: 27 mmol/L (ref 22–32)
Calcium: 9 mg/dL (ref 8.9–10.3)
Chloride: 99 mmol/L (ref 98–111)
Creatinine, Ser: 0.95 mg/dL (ref 0.61–1.24)
GFR calc Af Amer: >60 mL/min (ref >60)
GFR calc non Af Amer: >60 mL/min (ref >60)
Glucose, Bld: 161 mg/dL — ABNORMAL HIGH (ref 70–99)
Potassium: 4.2 mmol/L (ref 3.5–5.1)
Sodium: 137 mmol/L (ref 135–145)
Total Bilirubin: 0.8 mg/dL (ref 0.3–1.2)
Total Protein: 7.9 g/dL (ref 6.5–8.1)

## 2018-02-01 MED ORDER — ACETAMINOPHEN 500 MG PO TABS
1000.0000 mg | ORAL_TABLET | Freq: Three times a day (TID) | ORAL | Status: DC
  Administered 2018-02-01 – 2018-02-04 (×9): 1000 mg via ORAL
  Filled 2018-02-01 (×9): qty 2

## 2018-02-01 MED ORDER — IBUPROFEN 400 MG PO TABS
400.0000 mg | ORAL_TABLET | Freq: Three times a day (TID) | ORAL | Status: DC
  Administered 2018-02-01 – 2018-02-03 (×5): 400 mg via ORAL
  Filled 2018-02-01 (×5): qty 1

## 2018-02-01 MED ORDER — METHOCARBAMOL 750 MG PO TABS
750.0000 mg | ORAL_TABLET | Freq: Four times a day (QID) | ORAL | Status: DC
  Administered 2018-02-01 – 2018-02-04 (×12): 750 mg via ORAL
  Filled 2018-02-01 (×12): qty 1

## 2018-02-01 NOTE — Progress Notes (Signed)
[facility] Progress Note  1 Day Post-Op  Subjective: CC:  abd distention and pain, small volume emesis this AM. Minimal flatus. No BM. Mobilizing some. Pulling 1200 cc on IS.  We discussed perforated appendicitis and ileus.   Objective: Vital signs in last 24 hours: Temp:  [97.2 F (36.2 C)-98.6 F (37 C)] 98.1 F (36.7 C) (08/01 1007) Pulse Rate:  [63-87] 87 (08/01 1007) Resp:  [14-24] 15 (08/01 1007) BP: (111-155)/(80-102) 145/97 (08/01 1007) SpO2:  [92 %-100 %] 97 % (08/01 1007) Weight:  [93 kg (205 lb)-96 kg (211 lb 10.3 oz)] 96 kg (211 lb 10.3 oz) (07/31 1654) Last BM Date: 01/29/18  Intake/Output from previous day: 07/31 0701 - 08/01 0700 In: 2592 [I.V.:2401.9; IV Piggyback:190.1] Out: 590 [Urine:150; Drains:330; Blood:10] Intake/Output this shift: Total I/O In: -  Out: 65 [Drains:65]  PE: Gen:  Alert, NAD, cooperative  Card:  Regular rate and rhythm Pulm:  Normal effort Abd: Soft, approp tender, mild distention, +BS, incisions clean and dry, drain with SS output 330 cc/24h Skin: warm and dry, no rashes  Psych: A&Ox3   Lab Results:  Recent Labs    01/31/18 0441 02/01/18 0502  WBC 18.5* 16.9*  HGB 16.0 14.5  HCT 47.1 44.2  PLT 262 251   BMET Recent Labs    01/31/18 0441 02/01/18 0502  NA 133* 137  K 3.8 4.2  CL 98 99  CO2 23 27  GLUCOSE 193* 161*  BUN 9 6  CREATININE 1.09 0.95  CALCIUM 9.5 9.0   PT/INR No results for input(s): LABPROT, INR in the last 72 hours. CMP     Component Value Date/Time   NA 137 02/01/2018 0502   K 4.2 02/01/2018 0502   CL 99 02/01/2018 0502   CO2 27 02/01/2018 0502   GLUCOSE 161 (H) 02/01/2018 0502   BUN 6 02/01/2018 0502   CREATININE 0.95 02/01/2018 0502   CALCIUM 9.0 02/01/2018 0502   PROT 7.9 02/01/2018 0502   ALBUMIN 3.2 (L) 02/01/2018 0502   AST 13 (L) 02/01/2018 0502   ALT 16 02/01/2018 0502   ALKPHOS 55 02/01/2018 0502   BILITOT 0.8 02/01/2018 0502   GFRNONAA >60 02/01/2018 0502   GFRAA >60 02/01/2018 0502   Lipase     Component Value Date/Time   LIPASE 32 01/31/2018 0441       Studies/Results: Ct Abdomen Pelvis W Contrast  Result Date: 01/31/2018 CLINICAL DATA:  Right lower quadrant abdominal pain EXAM: CT ABDOMEN AND PELVIS WITH CONTRAST TECHNIQUE: Multidetector CT imaging of the abdomen and pelvis was performed using the standard protocol following bolus administration of intravenous contrast. CONTRAST:  OMNIPAQUE IOHEXOL 300 MG/ML  SOLN COMPARISON:  None. FINDINGS: Lower chest: Dependent atelectasis.  No effusions. Hepatobiliary: No focal hepatic abnormality. Gallbladder unremarkable. Pancreas: No focal abnormality or ductal dilatation. Spleen: No focal abnormality.  Normal size. Adrenals/Urinary Tract: No adrenal abnormality. No focal renal abnormality. No stones or hydronephrosis. Urinary bladder is unremarkable. Stomach/Bowel: Markedly abnormal appendix with mucosal enhancement and dilatation measuring up to 2 cm. Marked surrounding inflammation. The terminal ileum is also markedly abnormal and thickened as is the tip of the cecum. No evidence of bowel obstruction. Vascular/Lymphatic: No evidence of aneurysm or adenopathy. Reproductive: No visible focal abnormality. Other: Trace free fluid in the pelvis. No free air. Small bilateral inguinal hernias. Musculoskeletal: No acute bony abnormality. IMPRESSION: Markedly abnormal, thickened and dilated appendix as well as markedly abnormal adjacent terminal ileum. I favor that this represents acute  appendicitis with secondary inflammation of the adjacent terminal ileum as there appears to be more inflammation surrounding the appendix than the terminal ileum. It is difficult to completely exclude inflammatory bowel disease however. Trace free fluid in the pelvis. Electronically Signed   By: Charlett NoseKevin  Dover M.D.   On: 01/31/2018 07:51    Anti-infectives: Anti-infectives (From admission, onward)   Start     Dose/Rate Route  Frequency Ordered Stop   01/31/18 1415  piperacillin-tazobactam (ZOSYN) IVPB 3.375 g  Status:  Discontinued     3.375 g 12.5 mL/hr over 240 Minutes Intravenous Every 8 hours 01/31/18 1401 01/31/18 1531   01/31/18 1000  piperacillin-tazobactam (ZOSYN) IVPB 3.375 g     3.375 g 12.5 mL/hr over 240 Minutes Intravenous Every 8 hours 01/31/18 0937     01/31/18 0815  cefTRIAXone (ROCEPHIN) 2 g in sodium chloride 0.9 % 100 mL IVPB     2 g 200 mL/hr over 30 Minutes Intravenous  Once 01/31/18 0813 01/31/18 0921   01/31/18 0815  metroNIDAZOLE (FLAGYL) IVPB 500 mg     500 mg 100 mL/hr over 60 Minutes Intravenous  Once 01/31/18 0813 01/31/18 1030     Assessment/Plan CAD PMH STEMI s/p RCA stent 2014 - Plavix, last dose 7/29  Perforated appendicitis  POD#1 s/p lap appy, JP drain placement Dr. Luisa Hartornett 01/31/18 -  Back off diet to NPO w/ sips/ice due to nausea/vomiting  -  Multimodal pain control -  Continue JP drain -  CBC in AM, WBC trending down  -  Mobilize TID and IS    LOS: 1 day    Adam PhenixElizabeth S Simaan , [facility] 02/01/2018, 10:32 AM Pager: [phone] Consults: [phone] Mon-Fri 7:00 am-4:30 pm Sat-Sun 7:00 am-11:30 am

## 2018-02-01 NOTE — Progress Notes (Signed)
Patient and his wife expressed concern over patient's history of cardiac stent and his plavix being held due to the surgery. Patient planning to speak to PA/Surgeon in the morning, patient's wife will call Dr. Allyson SabalBerry, Cardiology for follow up and to express concerns. Night shift informed of concern and will pass on to 02/02/18 dayshift RN in the morning.  - Asher MuirJamie Mariany Mackintosh,RN

## 2018-02-01 NOTE — Anesthesia Postprocedure Evaluation (Signed)
Anesthesia Post Note  Patient: Reginald [name]  Procedure(s) Performed: PERFORATED APPENDECTOMY LAPAROSCOPIC, APPENDICEAL ABSCESS (N/A Abdomen)     Patient location during evaluation: PACU Anesthesia Type: General Level of consciousness: awake and alert Pain management: pain level controlled Vital Signs Assessment: post-procedure vital signs reviewed and stable Respiratory status: spontaneous breathing, nonlabored ventilation, respiratory function stable and patient connected to nasal cannula oxygen Cardiovascular status: blood pressure returned to baseline and stable Postop Assessment: no apparent nausea or vomiting Anesthetic complications: no    Last Vitals:  Vitals:   02/01/18 1339 02/01/18 1759  BP: 126/88 119/86  Pulse: 75 75  Resp: 16 17  Temp: 37 C 36.9 C  SpO2: 96% 96%    Last Pain:  Vitals:   02/01/18 1759  TempSrc: Oral  PainSc:                  [name]

## 2018-02-02 LAB — BASIC METABOLIC PANEL
Anion gap: 13 (ref 5–15)
BUN: 9 mg/dL (ref 6–20)
CO2: 25 mmol/L (ref 22–32)
Calcium: 8.2 mg/dL — ABNORMAL LOW (ref 8.9–10.3)
Chloride: 102 mmol/L (ref 98–111)
Creatinine, Ser: 1.09 mg/dL (ref 0.61–1.24)
GFR calc Af Amer: >60 mL/min (ref >60)
GFR calc non Af Amer: >60 mL/min (ref >60)
Glucose, Bld: 114 mg/dL — ABNORMAL HIGH (ref 70–99)
Potassium: 4.3 mmol/L (ref 3.5–5.1)
Sodium: 140 mmol/L (ref 135–145)

## 2018-02-02 LAB — CBC
HCT: 42.7 % (ref 39.0–52.0)
Hemoglobin: 14 g/dL (ref 13.0–17.0)
MCH: 29.9 pg (ref 26.0–34.0)
MCHC: 32.8 g/dL (ref 30.0–36.0)
MCV: 91.2 fL (ref 78.0–100.0)
Platelets: 256 10*3/uL (ref 150–400)
RBC: 4.68 MIL/uL (ref 4.22–5.81)
RDW: 12.9 % (ref 11.5–15.5)
WBC: 12.1 10*3/uL — ABNORMAL HIGH (ref 4.0–10.5)

## 2018-02-02 MED ORDER — CLOPIDOGREL BISULFATE 75 MG PO TABS
75.0000 mg | ORAL_TABLET | Freq: Every day | ORAL | Status: DC
  Administered 2018-02-02 – 2018-02-04 (×3): 75 mg via ORAL
  Filled 2018-02-02 (×3): qty 1

## 2018-02-02 MED ORDER — ZOLPIDEM TARTRATE 5 MG PO TABS
5.0000 mg | ORAL_TABLET | Freq: Every evening | ORAL | Status: DC | PRN
  Administered 2018-02-02 – 2018-02-03 (×2): 5 mg via ORAL
  Filled 2018-02-02 (×2): qty 1

## 2018-02-02 NOTE — Progress Notes (Signed)
[facility] Progress Note  2 Days Post-Op  Subjective: CC:  Distention and nausea improving, having small amounts of flatus. Still belching a lot. States drain fluid becoming more clear.  Objective: Vital signs in last 24 hours: Temp:  [98.1 F (36.7 C)-99.1 F (37.3 C)] 98.6 F (37 C) (08/02 0413) Pulse Rate:  [75-87] 79 (08/02 0413) Resp:  [15-24] 24 (08/02 0413) BP: (107-145)/(77-97) 117/81 (08/02 0413) SpO2:  [95 %-97 %] 95 % (08/02 0413) Last BM Date: 01/29/18  Intake/Output from previous day: 08/01 0701 - 08/02 0700 In: 3006.7 [P.O.:390; I.V.:2452.7; IV Piggyback:164] Out: 1420 [Urine:675; Drains:745] Intake/Output this shift: Total I/O In: -  Out: 70 [Drains:70]  PE: Gen:  Alert, NAD, pleasant and cooperative  Card:  Regular rate and rhythm Pulm:  Normal effort Abd: Soft, non-tender, mild distention, tinkering bowel sounds, drain SS  Skin: warm and dry, no rashes  Psych: A&Ox3   Lab Results:  Recent Labs    02/01/18 0502 02/02/18 0550  WBC 16.9* 12.1*  HGB 14.5 14.0  HCT 44.2 42.7  PLT 251 256   BMET Recent Labs    02/01/18 0502 02/02/18 0550  NA 137 140  K 4.2 4.3  CL 99 102  CO2 27 25  GLUCOSE 161* 114*  BUN 6 9  CREATININE 0.95 1.09  CALCIUM 9.0 8.2*   PT/INR No results for input(s): LABPROT, INR in the last 72 hours. CMP     Component Value Date/Time   NA 140 02/02/2018 0550   K 4.3 02/02/2018 0550   CL 102 02/02/2018 0550   CO2 25 02/02/2018 0550   GLUCOSE 114 (H) 02/02/2018 0550   BUN 9 02/02/2018 0550   CREATININE 1.09 02/02/2018 0550   CALCIUM 8.2 (L) 02/02/2018 0550   PROT 7.9 02/01/2018 0502   ALBUMIN 3.2 (L) 02/01/2018 0502   AST 13 (L) 02/01/2018 0502   ALT 16 02/01/2018 0502   ALKPHOS 55 02/01/2018 0502   BILITOT 0.8 02/01/2018 0502   GFRNONAA >60 02/02/2018 0550   GFRAA >60 02/02/2018 0550   Lipase     Component Value Date/Time   LIPASE 32 01/31/2018 0441       Studies/Results: No results  found.  Anti-infectives: Anti-infectives (From admission, onward)   Start     Dose/Rate Route Frequency Ordered Stop   01/31/18 1415  piperacillin-tazobactam (ZOSYN) IVPB 3.375 g  Status:  Discontinued     3.375 g 12.5 mL/hr over 240 Minutes Intravenous Every 8 hours 01/31/18 1401 01/31/18 1531   01/31/18 1000  piperacillin-tazobactam (ZOSYN) IVPB 3.375 g     3.375 g 12.5 mL/hr over 240 Minutes Intravenous Every 8 hours 01/31/18 0937     01/31/18 0815  cefTRIAXone (ROCEPHIN) 2 g in sodium chloride 0.9 % 100 mL IVPB     2 g 200 mL/hr over 30 Minutes Intravenous  Once 01/31/18 0813 01/31/18 0921   01/31/18 0815  metroNIDAZOLE (FLAGYL) IVPB 500 mg     500 mg 100 mL/hr over 60 Minutes Intravenous  Once 01/31/18 0813 01/31/18 1030     Assessment/Plan CAD PMH STEMI s/p RCA stent 2014 -  Resume plavix, CBC in AM  Perforated appendicitis  POD#2 s/p lap appy, JP drain placement Dr. Luisa Hart 01/31/18 -  post-op ileus, start sips from floor and await further bowel function -  Multimodal pain control -  Continue JP drain -  CBC in AM -  Mobilize TID and IS      LOS: 2 days  Adam PhenixElizabeth S Izael Bessinger , [facility] 02/02/2018, 9:38 AM Pager: [phone] Consults: [phone] Mon-Fri 7:00 am-4:30 pm Sat-Sun 7:00 am-11:30 am

## 2018-02-03 DIAGNOSIS — I252 Old myocardial infarction: Secondary | ICD-10-CM

## 2018-02-03 DIAGNOSIS — Z7901 Long term (current) use of anticoagulants: Secondary | ICD-10-CM

## 2018-02-03 DIAGNOSIS — Z955 Presence of coronary angioplasty implant and graft: Secondary | ICD-10-CM

## 2018-02-03 LAB — BASIC METABOLIC PANEL
Anion gap: 7 (ref 5–15)
BUN: 7 mg/dL (ref 6–20)
CO2: 27 mmol/L (ref 22–32)
Calcium: 8.3 mg/dL — ABNORMAL LOW (ref 8.9–10.3)
Chloride: 103 mmol/L (ref 98–111)
Creatinine, Ser: 1.11 mg/dL (ref 0.61–1.24)
GFR calc Af Amer: >60 mL/min (ref >60)
GFR calc non Af Amer: >60 mL/min (ref >60)
Glucose, Bld: 104 mg/dL — ABNORMAL HIGH (ref 70–99)
Potassium: 4.4 mmol/L (ref 3.5–5.1)
Sodium: 137 mmol/L (ref 135–145)

## 2018-02-03 LAB — CBC
HCT: 42.1 % (ref 39.0–52.0)
Hemoglobin: 13.8 g/dL (ref 13.0–17.0)
MCH: 29.7 pg (ref 26.0–34.0)
MCHC: 32.8 g/dL (ref 30.0–36.0)
MCV: 90.7 fL (ref 78.0–100.0)
Platelets: 268 10*3/uL (ref 150–400)
RBC: 4.64 MIL/uL (ref 4.22–5.81)
RDW: 13.2 % (ref 11.5–15.5)
WBC: 9.5 10*3/uL (ref 4.0–10.5)

## 2018-02-03 MED ORDER — HYDROCORTISONE 1 % EX CREA
1.0000 "application " | TOPICAL_CREAM | Freq: Three times a day (TID) | CUTANEOUS | Status: DC | PRN
  Filled 2018-02-03: qty 28

## 2018-02-03 MED ORDER — ENSURE SURGERY PO LIQD
237.0000 mL | Freq: Two times a day (BID) | ORAL | Status: DC
  Administered 2018-02-03: 237 mL via ORAL
  Filled 2018-02-03 (×4): qty 237

## 2018-02-03 MED ORDER — SODIUM CHLORIDE 0.9% FLUSH: 3.0000 mL | INTRAVENOUS | Status: DC | PRN

## 2018-02-03 MED ORDER — METOPROLOL TARTRATE 5 MG/5ML IV SOLN: 5.0000 mg | Freq: Four times a day (QID) | INTRAVENOUS | Status: DC | PRN

## 2018-02-03 MED ORDER — PHENOL 1.4 % MT LIQD: 1.0000 | OROMUCOSAL | Status: DC | PRN

## 2018-02-03 MED ORDER — LACTATED RINGERS IV BOLUS: 1000.0000 mL | Freq: Three times a day (TID) | INTRAVENOUS | Status: DC | PRN

## 2018-02-03 MED ORDER — ALUM & MAG HYDROXIDE-SIMETH 200-200-20 MG/5ML PO SUSP: 30.0000 mL | Freq: Four times a day (QID) | ORAL | Status: DC | PRN

## 2018-02-03 MED ORDER — SODIUM CHLORIDE 0.9% FLUSH
3.0000 mL | Freq: Two times a day (BID) | INTRAVENOUS | Status: DC
  Administered 2018-02-04: 3 mL via INTRAVENOUS

## 2018-02-03 MED ORDER — METHOCARBAMOL 1000 MG/10ML IJ SOLN
1000.0000 mg | Freq: Four times a day (QID) | INTRAVENOUS | Status: DC | PRN
  Filled 2018-02-03: qty 10

## 2018-02-03 MED ORDER — MAGIC MOUTHWASH: 15.0000 mL | Freq: Four times a day (QID) | ORAL | Status: DC | PRN

## 2018-02-03 MED ORDER — GUAIFENESIN-DM 100-10 MG/5ML PO SYRP: 10.0000 mL | ORAL_SOLUTION | ORAL | Status: DC | PRN

## 2018-02-03 MED ORDER — HYDROCORTISONE 2.5 % RE CREA
1.0000 "application " | TOPICAL_CREAM | Freq: Four times a day (QID) | RECTAL | Status: DC | PRN
  Filled 2018-02-03: qty 28.35

## 2018-02-03 MED ORDER — ASPIRIN EC 81 MG PO TBEC
81.0000 mg | DELAYED_RELEASE_TABLET | Freq: Every day | ORAL | Status: DC
  Administered 2018-02-03 – 2018-02-04 (×2): 81 mg via ORAL
  Filled 2018-02-03 (×2): qty 1

## 2018-02-03 MED ORDER — MENTHOL 3 MG MT LOZG: 1.0000 | LOZENGE | OROMUCOSAL | Status: DC | PRN

## 2018-02-03 MED ORDER — SODIUM CHLORIDE 0.9 % IV SOLN: 250.0000 mL | INTRAVENOUS | Status: DC | PRN

## 2018-02-03 MED ORDER — BLISTEX MEDICATED EX OINT
1.0000 "application " | TOPICAL_OINTMENT | Freq: Two times a day (BID) | CUTANEOUS | Status: DC
  Administered 2018-02-03 – 2018-02-04 (×3): 1 via TOPICAL
  Filled 2018-02-03: qty 6.3

## 2018-02-03 MED ORDER — GABAPENTIN 300 MG PO CAPS
300.0000 mg | ORAL_CAPSULE | Freq: Two times a day (BID) | ORAL | Status: DC
  Administered 2018-02-03 – 2018-02-04 (×3): 300 mg via ORAL
  Filled 2018-02-03 (×3): qty 1

## 2018-02-03 NOTE — Progress Notes (Signed)
[facility] Progress Note  3 Days Post-Op  Subjective: CC:  Pain less.  Tolerating sips.  Had liquid bowel movements.  Objective: Vital signs in last 24 hours: Temp:  [98 F (36.7 C)-98.5 F (36.9 C)] 98.5 F (36.9 C) (08/03 0438) Pulse Rate:  [65-83] 78 (08/03 0438) Resp:  [16-20] 20 (08/03 0438) BP: (115-125)/(73-90) 115/90 (08/03 0438) SpO2:  [97 %-100 %] 97 % (08/03 0438) Last BM Date: 02/02/18  Intake/Output from previous day: 08/02 0701 - 08/03 0700 In: 1454.8 [I.V.:1404.8; IV Piggyback:50] Out: 1705 [Urine:1175; Drains:530] Intake/Output this shift: No intake/output data recorded.  PE: General: Pt awake/alert/oriented x4 in no major acute distress Eyes: PERRL, normal EOM. Sclera nonicteric Neuro: CN II-XII intact w/o focal sensory/motor deficits. Lymph: No head/neck/groin lymphadenopathy Psych:  No delerium/psychosis/paranoia HENT: Normocephalic, Mucus membranes moist.  No thrush Neck: Supple, No tracheal deviation Chest: No pain.  Good respiratory excursion. CV:  Pulses intact.  Regular rhythm MS: Normal AROM mjr joints.  No obvious deformity  Abdomen: Obese but soft, Nondistended.  Minimal tenderness to palpation around drain only.  No peritonitis.  No guarding.   No incarcerated hernias.  Ext:  SCDs BLE.  No significant edema.  No cyanosis Skin: No petechiae / purpura   Lab Results:  Recent Labs    02/02/18 0550 02/03/18 0541  WBC 12.1* 9.5  HGB 14.0 13.8  HCT 42.7 42.1  PLT 256 268   BMET Recent Labs    02/02/18 0550 02/03/18 0541  NA 140 137  K 4.3 4.4  CL 102 103  CO2 25 27  GLUCOSE 114* 104*  BUN 9 7  CREATININE 1.09 1.11  CALCIUM 8.2* 8.3*   PT/INR No results for input(s): LABPROT, INR in the last 72 hours. CMP     Component Value Date/Time   NA 137 02/03/2018 0541   K 4.4 02/03/2018 0541   CL 103 02/03/2018 0541   CO2 27 02/03/2018 0541   GLUCOSE 104 (H) 02/03/2018 0541   BUN 7 02/03/2018 0541   CREATININE 1.11 02/03/2018 0541   CALCIUM 8.3 (L) 02/03/2018 0541   PROT 7.9 02/01/2018 0502   ALBUMIN 3.2 (L) 02/01/2018 0502   AST 13 (L) 02/01/2018 0502   ALT 16 02/01/2018 0502   ALKPHOS 55 02/01/2018 0502   BILITOT 0.8 02/01/2018 0502   GFRNONAA >60 02/03/2018 0541   GFRAA >60 02/03/2018 0541   Lipase     Component Value Date/Time   LIPASE 32 01/31/2018 0441       Studies/Results: No results found.  Anti-infectives: Anti-infectives (From admission, onward)   Start     Dose/Rate Route Frequency Ordered Stop   01/31/18 1415  piperacillin-tazobactam (ZOSYN) IVPB 3.375 g  Status:  Discontinued     3.375 g 12.5 mL/hr over 240 Minutes Intravenous Every 8 hours 01/31/18 1401 01/31/18 1531   01/31/18 1000  piperacillin-tazobactam (ZOSYN) IVPB 3.375 g     3.375 g 12.5 mL/hr over 240 Minutes Intravenous Every 8 hours 01/31/18 0937     01/31/18 0815  cefTRIAXone (ROCEPHIN) 2 g in sodium chloride 0.9 % 100 mL IVPB     2 g 200 mL/hr over 30 Minutes Intravenous  Once 01/31/18 0813 01/31/18 0921   01/31/18 0815  metroNIDAZOLE (FLAGYL) IVPB 500 mg     500 mg 100 mL/hr over 60 Minutes Intravenous  Once 01/31/18 0813 01/31/18 1030     Principal Problem:   Acute appendicitis with perforation/abscess s/p lap appendectomy 01/31/2018 Active Problems:   Coronary  artery disease   History of ST elevation myocardial infarction (STEMI)   History of coronary artery stent placement   Chronic anticoagulation (plavix)    Assessment/Plan   Perforated appendicitis  POD#3 s/p lap appy, JP drain placement Dr. Luisa Hartornett 01/31/18 -  post-op ileus solving.  Start on liquids.  Advance as tolerated. -  Multimodal pain control -  Continue JP drain given high-volume.  Seems more serious.  Hopefully out at discharge. - Antibiotics for 5 days given perforation and abscess. -  Mobilize TID and IS   -Resuming Plavix with hemoglobin stable.  No coronary events.  Hypertension controlled on  lisinopril metoprolol.  Controlling hypercholesterolemia   LOS: 3 days    Sheral ApleySteven C Arianna Delsanto  [facility] 02/03/2018, 8:43 AM Pager: [phone] Consults: [phone] Mon-Fri 7:00 am-4:30 pm Sat-Sun 7:00 am-11:30 am

## 2018-02-04 MED ORDER — AMOXICILLIN-POT CLAVULANATE 875-125 MG PO TABS: 1.0000 | ORAL_TABLET | Freq: Two times a day (BID) | ORAL | 1 refills | Status: DC

## 2018-02-04 MED ORDER — OXYCODONE HCL 5 MG PO TABS: 5.0000 mg | ORAL_TABLET | Freq: Four times a day (QID) | ORAL | 0 refills | Status: DC | PRN

## 2018-02-04 NOTE — Progress Notes (Signed)
Patient discharged to home with instructions. 

## 2018-02-04 NOTE — Discharge Instructions (Signed)
SURGERY: POST OP INSTRUCTIONS °(Surgery for small bowel obstruction, colon resection, etc) ° ° °###################################################################### ° °EAT °Gradually transition to a high fiber diet with a fiber supplement over the next few days after discharge ° °WALK °Walk an hour a day.  Control your pain to do that.   ° °CONTROL PAIN °Control pain so that you can walk, sleep, tolerate sneezing/coughing, go up/down stairs. ° °HAVE A BOWEL MOVEMENT DAILY °Keep your bowels regular to avoid problems.  OK to try a laxative to override constipation.  OK to use an antidairrheal to slow down diarrhea.  Call if not better after 2 tries ° °CALL IF YOU HAVE PROBLEMS/CONCERNS °Call if you are still struggling despite following these instructions. °Call if you have concerns not answered by these instructions ° °###################################################################### ° ° °DIET °Follow a light diet the first few days at home.  Start with a bland diet such as soups, liquids, starchy foods, low fat foods, etc.  If you feel full, bloated, or constipated, stay on a ful liquid or pureed/blenderized diet for a few days until you feel better and no longer constipated. °Be sure to drink plenty of fluids every day to avoid getting dehydrated (feeling dizzy, not urinating, etc.). °Gradually add a fiber supplement to your diet over the next week.  Gradually get back to a regular solid diet.  Avoid fast food or heavy meals the first week as you are more likely to get nauseated. °It is expected for your digestive tract to need a few months to get back to normal.  It is common for your bowel movements and stools to be irregular.  You will have occasional bloating and cramping that should eventually fade away.  Until you are eating solid food normally, off all pain medications, and back to regular activities; your bowels will not be normal. °Focus on eating a low-fat, high fiber diet the rest of your life  (See Getting to Good Bowel Health, below). ° °CARE of your INCISION or WOUND °It is good for closed incision and even open wounds to be washed every day.  Shower every day.  Short baths are fine.  Wash the incisions and wounds clean with soap & water.    °If you have a closed incision(s), wash the incision with soap & water every day.  You may leave closed incisions open to air if it is dry.   You may cover the incision with clean gauze & replace it after your daily shower for comfort. °If you have skin tapes (Steristrips) or skin glue (Dermabond) on your incision, leave them in place.  They will fall off on their own like a scab.  You may trim any edges that curl up with clean scissors.  If you have staples, set up an appointment for them to be removed in the office in 10 days after surgery.  °If you have a drain, wash around the skin exit site with soap & water and place a new dressing of gauze or band aid around the skin every day.  Keep the drain site clean & dry.    °If you have an open wound with packing, see wound care instructions.  In general, it is encouraged that you remove your dressing and packing, shower with soap & water, and replace your dressing once a day.  Pack the wound with clean gauze moistened with normal (0.9%) saline to keep the wound moist & uninfected.  Pressure on the dressing for 30 minutes will stop most wound   bleeding.  Eventually your body will heal & pull the open wound closed over the next few months.  °Raw open wounds will occasionally bleed or secrete yellow drainage until it heals closed.  Drain sites will drain a little until the drain is removed.  Even closed incisions can have mild bleeding or drainage the first few days until the skin edges scab over & seal.   °If you have an open wound with a wound vac, see wound vac care instructions. ° ° ° ° °ACTIVITIES as tolerated °Start light daily activities --- self-care, walking, climbing stairs-- beginning the day after surgery.   Gradually increase activities as tolerated.  Control your pain to be active.  Stop when you are tired.  Ideally, walk several times a day, eventually an hour a day.   °Most people are back to most day-to-day activities in a few weeks.  It takes 4-8 weeks to get back to unrestricted, intense activity. °If you can walk 30 minutes without difficulty, it is safe to try more intense activity such as jogging, treadmill, bicycling, low-impact aerobics, swimming, etc. °Save the most intensive and strenuous activity for last (Usually 4-8 weeks after surgery) such as sit-ups, heavy lifting, contact sports, etc.  Refrain from any intense heavy lifting or straining until you are off narcotics for pain control.  You will have off days, but things should improve week-by-week. °DO NOT PUSH THROUGH PAIN.  Let pain be your guide: If it hurts to do something, don't do it.  Pain is your body warning you to avoid that activity for another week until the pain goes down. °You may drive when you are no longer taking narcotic prescription pain medication, you can comfortably wear a seatbelt, and you can safely make sudden turns/stops to protect yourself without hesitating due to pain. °You may have sexual intercourse when it is comfortable. If it hurts to do something, stop. ° °MEDICATIONS °Take your usually prescribed home medications unless otherwise directed.   °Blood thinners:  °Usually you can restart any strong blood thinners after the second postoperative day.  It is OK to take aspirin right away.    ° If you are on strong blood thinners (warfarin/Coumadin, Plavix, Xerelto, Eliquis, Pradaxa, etc), discuss with your surgeon, medicine PCP, and/or cardiologist for instructions on when to restart the blood thinner & if blood monitoring is needed (PT/INR blood check, etc).   ° ° °PAIN CONTROL °Pain after surgery or related to activity is often due to strain/injury to muscle, tendon, nerves and/or incisions.  This pain is usually  short-term and will improve in a few months.  °To help speed the process of healing and to get back to regular activity more quickly, DO THE FOLLOWING THINGS TOGETHER: °1. Increase activity gradually.  DO NOT PUSH THROUGH PAIN °2. Use Ice and/or Heat °3. Try Gentle Massage and/or Stretching °4. Take over the counter pain medication °5. Take Narcotic prescription pain medication for more severe pain ° °Good pain control = faster recovery.  It is better to take more medicine to be more active than to stay in bed all day to avoid medications. °1.  Increase activity gradually °Avoid heavy lifting at first, then increase to lifting as tolerated over the next 6 weeks. °Do not “push through” the pain.  Listen to your body and avoid positions and maneuvers than reproduce the pain.  Wait a few days before trying something more intense °Walking an hour a day is encouraged to help your body recover faster   and more safely.  Start slowly and stop when getting sore.  If you can walk 30 minutes without stopping or pain, you can try more intense activity (running, jogging, aerobics, cycling, swimming, treadmill, sex, sports, weightlifting, etc.) °Remember: If it hurts to do it, then don’t do it! °2. Use Ice and/or Heat °You will have swelling and bruising around the incisions.  This will take several weeks to resolve. °Ice packs or heating pads (6-8 times a day, 30-60 minutes at a time) will help sooth soreness & bruising. °Some people prefer to use ice alone, heat alone, or alternate between ice & heat.  Experiment and see what works best for you.  Consider trying ice for the first few days to help decrease swelling and bruising; then, switch to heat to help relax sore spots and speed recovery. °Shower every day.  Short baths are fine.  It feels good!  Keep the incisions and wounds clean with soap & water.   °3. Try Gentle Massage and/or Stretching °Massage at the area of pain many times a day °Stop if you feel pain - do not  overdo it °4. Take over the counter pain medication °This helps the muscle and nerve tissues become less irritable and calm down faster °Choose ONE of the following over-the-counter anti-inflammatory medications: °Acetaminophen 500mg tabs (Tylenol) 1-2 pills with every meal and just before bedtime (avoid if you have liver problems or if you have acetaminophen in you narcotic prescription) °Naproxen 220mg tabs (ex. Aleve, Naprosyn) 1-2 pills twice a day (avoid if you have kidney, stomach, IBD, or bleeding problems) °Ibuprofen 200mg tabs (ex. Advil, Motrin) 3-4 pills with every meal and just before bedtime (avoid if you have kidney, stomach, IBD, or bleeding problems) °Take with food/snack several times a day as directed for at least 2 weeks to help keep pain / soreness down & more manageable. °5. Take Narcotic prescription pain medication for more severe pain °A prescription for strong pain control is often given to you upon discharge (for example: oxycodone/Percocet, hydrocodone/Norco/Vicodin, or tramadol/Ultram) °Take your pain medication as prescribed. °Be mindful that most narcotic prescriptions contain Tylenol (acetaminophen) as well - avoid taking too much Tylenol. °If you are having problems/concerns with the prescription medicine (does not control pain, nausea, vomiting, rash, itching, etc.), please call us [phone] to see if we need to switch you to a different pain medicine that will work better for you and/or control your side effects better. °If you need a refill on your pain medication, you must call the office before 4 pm and on weekdays only.  By federal law, prescriptions for narcotics cannot be called into a pharmacy.  They must be filled out on paper & picked up from our office by the patient or authorized caretaker.  Prescriptions cannot be filled after 4 pm nor on weekends.   ° °WHEN TO CALL US [phone] °Severe uncontrolled or worsening pain  °Fever over 101 F (38.5 C) °Concerns with  the incision: Worsening pain, redness, rash/hives, swelling, bleeding, or drainage °Reactions / problems with new medications (itching, rash, hives, nausea, etc.) °Nausea and/or vomiting °Difficulty urinating °Difficulty breathing °Worsening fatigue, dizziness, lightheadedness, blurred vision °Other concerns °If you are not getting better after two weeks or are noticing you are getting worse, contact our office [phone] for further advice.  We may need to adjust your medications, re-evaluate you in the office, send you to the emergency room, or see what other things we can do to help. °The   clinic staff is available to answer your questions during regular business hours (8:30am-5pm).  Please don’t hesitate to call and ask to speak to one of our nurses for clinical concerns.    °A surgeon from [facility] is always on call at the hospitals 24 hours/day °If you have a medical emergency, go to the nearest emergency room or call 911. ° °FOLLOW UP in our office °One the day of your discharge from the hospital (or the next business weekday), please call [facility] to set up or confirm an appointment to see your surgeon in the office for a follow-up appointment.  Usually it is 2-3 weeks after your surgery.   °If you have skin staples at your incision(s), let the office know so we can set up a time in the office for the nurse to remove them (usually around 10 days after surgery). °Make sure that you call for appointments the day of discharge (or the next business weekday) from the hospital to ensure a convenient appointment time. °IF YOU HAVE DISABILITY OR FAMILY LEAVE FORMS, BRING THEM TO THE OFFICE FOR PROCESSING.  DO NOT GIVE THEM TO YOUR DOCTOR. ° °[facility] °[address], [address], [place], [place]  [address] ? °[phone] - Main °[phone] - Toll Free,  [phone] - Fax °[email or web address] ° °GETTING TO GOOD BOWEL HEALTH. °It is  expected for your digestive tract to need a few months to get back to normal.  It is common for your bowel movements and stools to be irregular.  You will have occasional bloating and cramping that should eventually fade away.  Until you are eating solid food normally, off all pain medications, and back to regular activities; your bowels will not be normal.   °Avoiding constipation °The goal: ONE SOFT BOWEL MOVEMENT A DAY!    °Drink plenty of fluids.  Choose water first. °TAKE A FIBER SUPPLEMENT EVERY DAY THE REST OF YOUR LIFE °During your first week back home, gradually add back a fiber supplement every day °Experiment which form you can tolerate.   There are many forms such as powders, tablets, wafers, gummies, etc °Psyllium bran (Metamucil), methylcellulose (Citrucel), Miralax or Glycolax, Benefiber, Flax Seed.  °Adjust the dose week-by-week (1/2 dose/day to 6 doses a day) until you are moving your bowels 1-2 times a day.  Cut back the dose or try a different fiber product if it is giving you problems such as diarrhea or bloating. °Sometimes a laxative is needed to help jump-start bowels if constipated until the fiber supplement can help regulate your bowels.  If you are tolerating eating & you are farting, it is okay to try a gentle laxative such as double dose MiraLax, prune juice, or Milk of Magnesia.  Avoid using laxatives too often. °Stool softeners can sometimes help counteract the constipating effects of narcotic pain medicines.  It can also cause diarrhea, so avoid using for too long. °If you are still constipated despite taking fiber daily, eating solids, and a few doses of laxatives, call our office. °Controlling diarrhea °Try drinking liquids and eating bland foods for a few days to avoid stressing your intestines further. °Avoid dairy products (especially milk & ice cream) for a short time.  The intestines often can lose the ability to digest lactose when stressed. °Avoid foods that cause gassiness or  bloating.  Typical foods include beans and other legumes, cabbage, broccoli, and dairy foods.  Avoid greasy, spicy, fast foods.  Every person has   some sensitivity to other foods, so listen to your body and avoid those foods that trigger problems for you. °Probiotics (such as active yogurt, Align, etc) may help repopulate the intestines and colon with normal bacteria and calm down a sensitive digestive tract °Adding a fiber supplement gradually can help thicken stools by absorbing excess fluid and retrain the intestines to act more normally.  Slowly increase the dose over a few weeks.  Too much fiber too soon can backfire and cause cramping & bloating. °It is okay to try and slow down diarrhea with a few doses of antidiarrheal medicines.   °Bismuth subsalicylate (ex. Kayopectate, Pepto Bismol) for a few doses can help control diarrhea.  Avoid if pregnant.   °Loperamide (Imodium) can slow down diarrhea.  Start with one tablet (2mg) first.  Avoid if you are having fevers or severe pain.  °ILEOSTOMY PATIENTS WILL HAVE CHRONIC DIARRHEA since their colon is not in use.    °Drink plenty of liquids.  You will need to drink even more glasses of water/liquid a day to avoid getting dehydrated. °Record output from your ileostomy.  Expect to empty the bag every 3-4 hours at first.  Most people with a permanent ileostomy empty their bag 4-6 times at the least.   °Use antidiarrheal medicine (especially Imodium) several times a day to avoid getting dehydrated.  Start with a dose at bedtime & breakfast.  Adjust up or down as needed.  Increase antidiarrheal medications as directed to avoid emptying the bag more than 8 times a day (every 3 hours). °Work with your wound ostomy nurse to learn care for your ostomy.  See ostomy care instructions. °TROUBLESHOOTING IRREGULAR BOWELS °1) Start with a soft & bland diet. No spicy, greasy, or fried foods.  °2) Avoid gluten/wheat or dairy products from diet to see if symptoms improve. °3) Miralax  17gm or flax seed mixed in 8oz. water or juice-daily. May use 2-4 times a day as needed. °4) Gas-X, Phazyme, etc. as needed for gas & bloating.  °5) Prilosec (omeprazole) over-the-counter as needed °6)  Consider probiotics (Align, Activa, etc) to help calm the bowels down ° °Call your doctor if you are getting worse or not getting better.  Sometimes further testing (cultures, endoscopy, X-ray studies, CT scans, bloodwork, etc.) may be needed to help diagnose and treat the cause of the diarrhea. °[facility] °[address], [address], [place], [place]  [address] °[phone] - Main.    °[phone]  - Toll Free.   [phone] - Fax °[email or web address] ° ° ° °Appendicitis °The appendix is a finger-shaped tube that is attached to the large intestine. Appendicitis is inflammation of the appendix. Without treatment, appendicitis can cause the appendix to tear (rupture). A ruptured appendix can lead to a life-threatening infection. It can also lead to the formation of a painful collection of pus (abscess) in the appendix. °What are the causes? °This condition may be caused by a blockage in the appendix that leads to infection. The blockage can be due to: °· A ball of stool. °· Enlarged lymph glands. °In some cases, the cause may not be known. °What increases the risk? °This condition is more likely to develop in people who are 10-30 years of age. °What are the signs or symptoms? °Symptoms of this condition include: °· Pain around the belly button that moves toward the lower right abdomen. The pain can become more severe as time passes. It gets worse with coughing or sudden movements. °·   Tenderness in the lower right abdomen. °· Nausea. °· Vomiting. °· Loss of appetite. °· Fever. °· Constipation. °· Diarrhea. °· Generally not feeling well. °How is this diagnosed? °This condition may be diagnosed with: °· A physical exam. °· Blood tests. °· Urine test. °To confirm the diagnosis, an  ultrasound, MRI, or CT scan may be done. °How is this treated? °This condition is usually treated by taking out the appendix (appendectomy). There are two methods for doing an appendectomy: °· Open appendectomy. In this surgery, the appendix is removed through a large cut (incision) that is made in the lower right abdomen. This procedure may be recommended if: °¨ You have major scarring from a previous surgery. °¨ You have a bleeding disorder. °¨ You are pregnant and are near term. °¨ You have a condition that makes the laparoscopic procedure impossible, such as an advanced infection or a ruptured appendix. °· Laparoscopic appendectomy. In this surgery, the appendix is removed through small incisions. This procedure usually causes less pain and fewer problems than an open appendectomy. It also has a shorter recovery time. °If the appendix has ruptured and an abscess has formed, a drain may be placed into the abscess to remove fluid and antibiotic medicines may be given through an IV tube. The appendix may or may not need to be removed. °This information is not intended to replace advice given to you by your health care provider. Make sure you discuss any questions you have with your health care provider. °Document Released: 06/20/2005 Document Revised: 10/28/2015 Document Reviewed: 11/05/2014 °[employer] Interactive Patient Education © 2017 [employer] ° °

## 2018-02-04 NOTE — Discharge Summary (Signed)
Physician Discharge Summary    Patient ID: Marguerite Jarboe MRN: [id number] DOB/AGE: [birth year]  57 y.o.  Admit date: 01/31/2018 Discharge date: 02/04/2018   Hospital Stay = 4 days  Patient Care Team: Vernie Shanks, MD as PCP - General (Family Medicine) Lorretta Harp, MD as Consulting Physician (Cardiology) Erroll Luna, MD as Consulting Physician (General Surgery)  Discharge Diagnoses:  Principal Problem:   Acute appendicitis with perforation/abscess s/p lap appendectomy 01/31/2018 Active Problems:   Coronary artery disease   History of ST elevation myocardial infarction (STEMI)   History of coronary artery stent placement   Chronic anticoagulation (plavix)   4 Days Post-Op  01/31/2018  POST-OPERATIVE DIAGNOSIS:   Acute appendicitis  SURGERY:  01/31/2018  APPENDECTOMY LAPAROSCOPIC DRAINAGE OF APPENDICEAL ABSCESS  SURGEON:    Surgeon(s): Erroll Luna, MD  Consults: None  Hospital Course:   Patient with a remote history of coronary disease and heart attack requiring stenting in 2014.  Came in with abdominal pain.  Exam and CT scan concerning for appendicitis.  Underwent urgent laparoscopic appendectomy.  Evidence of perforation.  Abscess drained.  Washed out.  Drain in place.  Ileus resolved gradually.   postoperatively, the patient gradually mobilized and advanced to a solid diet.  Pain and other symptoms were treated aggressively.    By the time of discharge, the patient was walking well the hallways, eating food, having flatus.  Pain was well-controlled on an oral medications.  Based on meeting discharge criteria and continuing to recover, I felt it was safe for the patient to be discharged from the hospital to further recover with close followup. Postoperative recommendations were discussed in detail.  They are written as well.  Discharged Condition: good  Disposition:  [facility], [place]. Schedule an appointment as  soon as possible for a visit in 2 week(s).   Specialty:  General Surgery Why:  To follow-up on your emergency surgery to remove your perforated appendix. Contact information: [address] [place] [phone]          Discharge disposition: 01-Home or Self Care       Discharge Instructions    Call MD for:   Complete by:  As directed    FEVER > 101.5 F  (temperatures < 101.5 F are not significant)   Call MD for:  extreme fatigue   Complete by:  As directed    Call MD for:  persistant dizziness or light-headedness   Complete by:  As directed    Call MD for:  persistant nausea and vomiting   Complete by:  As directed    Call MD for:  redness, tenderness, or signs of infection (pain, swelling, redness, odor or green/yellow discharge around incision site)   Complete by:  As directed    Call MD for:  severe uncontrolled pain   Complete by:  As directed    Diet - low sodium heart healthy   Complete by:  As directed    Start with a bland diet such as soups, liquids, starchy foods, low fat foods, etc. the first few days at home. Gradually advance to a solid, low-fat, high fiber diet by the end of the first week at home.   Add a fiber supplement to your diet (Metamucil, etc) If you feel full, bloated, or constipated, stay on a full liquid or pureed/blenderized diet for a few days until you feel better and are no longer constipated.  Discharge instructions   Complete by:  As directed    See Discharge Instructions If you are not getting better after two weeks or are noticing you are getting worse, contact our office [phone] for further advice.  We may need to adjust your medications, re-evaluate you in the office, send you to the emergency room, or see what other things we can do to help. The clinic staff is available to answer your questions during regular business hours (8:30am-5pm).  Please don't hesitate to call and ask to  speak to one of our nurses for clinical concerns.    A surgeon from [facility] is always on call at the hospitals 24 hours/day If you have a medical emergency, go to the nearest emergency room or call 911.   Discharge wound care:   Complete by:  As directed    It is good for closed incisions and even open wounds to be washed every day.  Shower every day.  Short baths are fine.  Wash the incisions and wounds clean with soap & water.     You may leave closed incisions open to air if it is dry.   You may cover the incision with clean gauze & replace it after your daily shower for comfort.   If you have skin glue (Dermabond) on your incision, leave them in place.  They will fall off on their own like a scab.  You may trim any edges that curl up with clean scissors.   Driving Restrictions   Complete by:  As directed    You may drive when: - you are no longer taking narcotic prescription pain medication - you can comfortably wear a seatbelt - you can safely make sudden turns/stops without pain.   Increase activity slowly   Complete by:  As directed    Start light daily activities --- self-care, walking, climbing stairs- beginning the day after surgery.  Gradually increase activities as tolerated.  Control your pain to be active.  Stop when you are tired.  Ideally, walk several times a day, eventually an hour a day.   Most people are back to most day-to-day activities in a few weeks.  It takes 4-6 weeks to get back to unrestricted, intense activity. If you can walk 30 minutes without difficulty, it is safe to try more intense activity such as jogging, treadmill, bicycling, low-impact aerobics, swimming, etc. Save the most intensive and strenuous activity for last (Usually 4-8 weeks after surgery) such as sit-ups, heavy lifting, contact sports, etc.  Refrain from any intense heavy lifting or straining until you are off narcotics for pain control.  You will have off days, but things should  improve week-by-week. DO NOT PUSH THROUGH PAIN.  Let pain be your guide: If it hurts to do something, don't do it.   Lifting restrictions   Complete by:  As directed    If you can walk 30 minutes without difficulty, it is safe to try more intense activity such as jogging, treadmill, bicycling, low-impact aerobics, swimming, etc. Save the most intensive and strenuous activity for last (Usually 4-8 weeks after surgery) such as sit-ups, heavy lifting, contact sports, etc.   Refrain from any intense heavy lifting or straining until you are off narcotics for pain control.  You will have off days, but things should improve week-by-week. DO NOT PUSH THROUGH PAIN.  Let pain be your guide: If it hurts to do something, don't do it.  Pain is your body warning you  to avoid that activity for another week until the pain goes down.   May shower / Bathe   Complete by:  As directed    May walk up steps   Complete by:  As directed    Sexual Activity Restrictions   Complete by:  As directed    You may have sexual intercourse when it is comfortable. If it hurts to do something, stop.      Allergies as of 02/04/2018   No Known Allergies     Medication List    TAKE these medications   amoxicillin-clavulanate 875-125 MG tablet Commonly known as:  AUGMENTIN Take 1 tablet by mouth 2 (two) times daily.   aspirin EC 81 MG tablet Take 81 mg by mouth daily.   atorvastatin 40 MG tablet Commonly known as:  LIPITOR Take 1 tablet (40 mg total) by mouth daily at 6 PM.   clopidogrel 75 MG tablet Commonly known as:  PLAVIX Take 1 tablet (75 mg total) by mouth daily.   co-enzyme Q-10 30 MG capsule Take 1 capsule (30 mg total) by mouth daily.   lisinopril 5 MG tablet Commonly known as:  PRINIVIL,ZESTRIL Take 1 tablet (5 mg total) by mouth daily.   metoprolol succinate 25 MG 24 hr tablet Commonly known as:  TOPROL-XL Take 0.5 tablets (12.5 mg total) by mouth daily.   nitroGLYCERIN 0.4 MG SL  tablet Commonly known as:  NITROSTAT Place 1 tablet (0.4 mg total) under the tongue every 5 (five) minutes as needed.   oxyCODONE 5 MG immediate release tablet Commonly known as:  Oxy IR/ROXICODONE Take 1-2 tablets (5-10 mg total) by mouth every 6 (six) hours as needed for breakthrough pain.   Vitamin D3 2000 units Tabs Take 2,000 Units by mouth daily.            Discharge Care Instructions  (From admission, onward)        Start     Ordered   02/04/18 0000  Discharge wound care:    Comments:  It is good for closed incisions and even open wounds to be washed every day.  Shower every day.  Short baths are fine.  Wash the incisions and wounds clean with soap & water.     You may leave closed incisions open to air if it is dry.   You may cover the incision with clean gauze & replace it after your daily shower for comfort.   If you have skin glue (Dermabond) on your incision, leave them in place.  They will fall off on their own like a scab.  You may trim any edges that curl up with clean scissors.   02/04/18 0909      Significant Diagnostic Studies:  Results for orders placed or performed during the hospital encounter of 01/31/18 (from the past 72 hour(s))  CBC     Status: Abnormal   Collection Time: 02/02/18  5:50 AM  Result Value Ref Range   WBC 12.1 (H) 4.0 - 10.5 K/uL   RBC 4.68 4.22 - 5.81 MIL/uL   Hemoglobin 14.0 13.0 - 17.0 g/dL   HCT 42.7 39.0 - 52.0 %   MCV 91.2 78.0 - 100.0 fL   MCH 29.9 26.0 - 34.0 pg   MCHC 32.8 30.0 - 36.0 g/dL   RDW 12.9 11.5 - 15.5 %   Platelets 256 150 - 400 K/uL    Comment: Performed at [facility] [address], [place], [place] [address]  Basic metabolic  panel     Status: Abnormal   Collection Time: 02/02/18  5:50 AM  Result Value Ref Range   Sodium 140 135 - 145 mmol/L   Potassium 4.3 3.5 - 5.1 mmol/L   Chloride 102 98 - 111 mmol/L   CO2 25 22 - 32 mmol/L   Glucose, Bld 114 (H) 70 - 99 mg/dL   BUN 9 6 - 20 mg/dL    Creatinine, Ser 1.09 0.61 - 1.24 mg/dL   Calcium 8.2 (L) 8.9 - 10.3 mg/dL   GFR calc non Af Amer >60 >60 mL/min   GFR calc Af Amer >60 >60 mL/min    Comment: (NOTE) The eGFR has been calculated using the CKD EPI equation. This calculation has not been validated in all clinical situations. eGFR's persistently <60 mL/min signify possible Chronic Kidney Disease.    Anion gap 13 5 - 15    Comment: Performed at [facility] [address], [place], [place] [address]  CBC     Status: None   Collection Time: 02/03/18  5:41 AM  Result Value Ref Range   WBC 9.5 4.0 - 10.5 K/uL   RBC 4.64 4.22 - 5.81 MIL/uL   Hemoglobin 13.8 13.0 - 17.0 g/dL   HCT 42.1 39.0 - 52.0 %   MCV 90.7 78.0 - 100.0 fL   MCH 29.7 26.0 - 34.0 pg   MCHC 32.8 30.0 - 36.0 g/dL   RDW 13.2 11.5 - 15.5 %   Platelets 268 150 - 400 K/uL    Comment: Performed at [facility], [place] [address], [place], [place] [address]  Basic metabolic panel     Status: Abnormal   Collection Time: 02/03/18  5:41 AM  Result Value Ref Range   Sodium 137 135 - 145 mmol/L   Potassium 4.4 3.5 - 5.1 mmol/L   Chloride 103 98 - 111 mmol/L   CO2 27 22 - 32 mmol/L   Glucose, Bld 104 (H) 70 - 99 mg/dL   BUN 7 6 - 20 mg/dL   Creatinine, Ser 1.11 0.61 - 1.24 mg/dL   Calcium 8.3 (L) 8.9 - 10.3 mg/dL   GFR calc non Af Amer >60 >60 mL/min   GFR calc Af Amer >60 >60 mL/min    Comment: (NOTE) The eGFR has been calculated using the CKD EPI equation. This calculation has not been validated in all clinical situations. eGFR's persistently <60 mL/min signify possible Chronic Kidney Disease.    Anion gap 7 5 - 15    Comment: Performed at [facility] [address], [place], [place] [address]    No results found.   Appendix, Other than Incidental - ACUTE APPENDICITIS WITH SEROSITIS AND PERIAPPENDICEAL INFLAMMATION DAWN BUTLER MD Pathologist, Electronic Signature (Case signed 02/01/2018) Specimen Deklyn Trachtenberg and Clinical  Information Specimen(s) Obtained: Appendix, Other than Incidental Specimen Clinical Information Acute Appendicitis (nt) Lorianna Spadaccini Received in formalin is a disrupted appendix received in multiple pieces which reapproximated measures 4.8 cm in length x up 1.0 cm in diameter. The serosa is focally disrupted, tan red to hemorrhagic. The mucosa is focally hemorrhage. There are no discrete masses. Sections are submitted in one cassette. (GP:gt, 01/31/18) Report signed out from the following location(s) Technical component and interpretation was performed at [facility], [place], [place] [address]. CLIA #: [id number]  Discharge Exam: Blood pressure (!) 131/91, pulse 87, temperature 98.7 F (37.1 C), temperature source Oral, resp. rate 20, height _0  (1.753 m),  weight 96 kg (211 lb 10.3 oz), SpO2 97 %.  General: Pt awake/alert/oriented x4 in No acute distress Eyes: PERRL, normal EOM.  Sclera clear.  No icterus Neuro: CN II-XII intact w/o focal sensory/motor deficits. Lymph: No head/neck/groin lymphadenopathy Psych:  No delerium/psychosis/paranoia HENT: Normocephalic, Mucus membranes moist.  No thrush Neck: Supple, No tracheal deviation Chest: No chest wall pain w good excursion CV:  Pulses intact.  Regular rhythm MS: Normal AROM mjr joints.  No obvious deformity Abdomen: Soft.  Mildly distended.  Mildly tender at incisions only.  No evidence of peritonitis.  No incarcerated hernias. Ext:  SCDs BLE.  No mjr edema.  No cyanosis Skin: No petechiae / purpura  Past Medical History:  Diagnosis Date  . Coronary artery disease 03/15/2013   STEMI RCA  . Hyperlipidemia   . Hypertension   . STEMI (ST elevation myocardial infarction) ([facility] [place]) 03/2013   Archie Endo 04/08/2013    Past Surgical History:  Procedure Laterality Date  . APPENDECTOMY  01/31/2018   PERFORATED APPENDECTOMY LAPAROSCOPIC, APPENDICEAL ABSCESS  . CORONARY ANGIOPLASTY WITH STENT PLACEMENT  03/15/2013    pci [id number] MM XCIENCE  . LAPAROSCOPIC APPENDECTOMY N/A 01/31/2018   Procedure: PERFORATED APPENDECTOMY LAPAROSCOPIC, APPENDICEAL ABSCESS;  Surgeon: Erroll Luna, MD;  Location: [facility];  Service: General;  Laterality: N/A;    Social History   Socioeconomic History  . Marital status: Married    Spouse name: Not on file  . Number of children: Not on file  . Years of education: Not on file  . Highest education level: Not on file  Occupational History  . Not on file  Social Needs  . Financial resource strain: Not on file  . Food insecurity:    Worry: Not on file    Inability: Not on file  . Transportation needs:    Medical: Not on file    Non-medical: Not on file  Tobacco Use  . Smoking status: Never Smoker  . Smokeless tobacco: Never Used  Substance and Sexual Activity  . Alcohol use: Yes    Alcohol/week: 6.6 oz    Types: 11 Cans of beer per week  . Drug use: Never  . Sexual activity: Yes  Lifestyle  . Physical activity:    Days per week: Not on file    Minutes per session: Not on file  . Stress: Not on file  Relationships  . Social connections:    Talks on phone: Not on file    Gets together: Not on file    Attends religious service: Not on file    Active member of club or organization: Not on file    Attends meetings of clubs or organizations: Not on file    Relationship status: Not on file  . Intimate partner violence:    Fear of current or ex partner: Not on file    Emotionally abused: Not on file    Physically abused: Not on file    Forced sexual activity: Not on file  Other Topics Concern  . Not on file  Social History Narrative  . Not on file    Family History  Problem Relation Age of Onset  . Lung cancer Father   . Bone cancer Father     Current Facility-Administered Medications  Medication Dose Route Frequency Provider Last Rate Last Dose  . 0.9 %  sodium chloride infusion  250 mL Intravenous PRN Michael Boston, MD      . acetaminophen  (TYLENOL) tablet 1,000 mg  1,000 mg Oral  Q8H Jill Alexanders, PA-C   1,000 mg at 02/04/18 0434  . alum & mag hydroxide-simeth (MAALOX/MYLANTA) 200-200-20 MG/5ML suspension 30 mL  30 mL Oral Q6H PRN Michael Boston, MD      . aspirin EC tablet 81 mg  81 mg Oral Daily Michael Boston, MD   81 mg at 02/03/18 1038  . atorvastatin (LIPITOR) tablet 40 mg  40 mg Oral q1800 Cornett, Marcello Moores, MD   40 mg at 02/03/18 1747  . cholecalciferol (VITAMIN D) tablet 2,000 Units  2,000 Units Oral Daily Cornett, Marcello Moores, MD   2,000 Units at 02/03/18 1039  . clopidogrel (PLAVIX) tablet 75 mg  75 mg Oral Daily Jill Alexanders, PA-C   75 mg at 02/03/18 1038  . enoxaparin (LOVENOX) injection 40 mg  40 mg Subcutaneous Q24H Cornett, Thomas, MD   40 mg at 02/04/18 0746  . feeding supplement (ENSURE SURGERY) liquid 237 mL  237 mL Oral BID BM Michael Boston, MD   237 mL at 02/03/18 1043  . fentaNYL (SUBLIMAZE) injection 50 mcg  50 mcg Intravenous Q1H PRN Erroll Luna, MD   50 mcg at 01/31/18 1543  . gabapentin (NEURONTIN) capsule 300 mg  300 mg Oral BID Michael Boston, MD   300 mg at 02/03/18 2137  . guaiFENesin-dextromethorphan (ROBITUSSIN DM) 100-10 MG/5ML syrup 10 mL  10 mL Oral Q4H PRN Michael Boston, MD      . hydrocortisone (ANUSOL-HC) 2.5 % rectal cream 1 application  1 application Topical QID PRN Michael Boston, MD      . hydrocortisone cream 1 % 1 application  1 application Topical TID PRN Michael Boston, MD      . lactated ringers bolus 1,000 mL  1,000 mL Intravenous TID PRN Michael Boston, MD      . lip balm (BLISTEX) ointment 1 application  1 application Topical BID Michael Boston, MD   1 application at 16/10/96 2139  . lisinopril (PRINIVIL,ZESTRIL) tablet 5 mg  5 mg Oral Daily Cornett, Thomas, MD   5 mg at 02/03/18 1038  . magic mouthwash  15 mL Oral QID PRN Michael Boston, MD      . menthol-cetylpyridinium (CEPACOL) lozenge 3 mg  1 lozenge Oral PRN Michael Boston, MD      . methocarbamol (ROBAXIN) 1,000 mg in  dextrose 5 % 50 mL IVPB  1,000 mg Intravenous Q6H PRN Michael Boston, MD      . methocarbamol (ROBAXIN) tablet 750 mg  750 mg Oral Q6H Jill Alexanders, PA-C   750 mg at 02/04/18 0432  . metoprolol succinate (TOPROL-XL) 24 hr tablet 12.5 mg  12.5 mg Oral Daily Cornett, Thomas, MD   12.5 mg at 02/03/18 1039  . metoprolol tartrate (LOPRESSOR) injection 5 mg  5 mg Intravenous Q6H PRN Michael Boston, MD      . nitroGLYCERIN (NITROSTAT) SL tablet 0.4 mg  0.4 mg Sublingual Q5 min PRN Cornett, Thomas, MD      . ondansetron (ZOFRAN-ODT) disintegrating tablet 4 mg  4 mg Oral Q6H PRN Cornett, Thomas, MD       Or  . ondansetron (ZOFRAN) injection 4 mg  4 mg Intravenous Q6H PRN Erroll Luna, MD   4 mg at 02/01/18 0804  . oxyCODONE (Oxy IR/ROXICODONE) immediate release tablet 5-10 mg  5-10 mg Oral Q4H PRN Erroll Luna, MD   10 mg at 02/04/18 0432  . phenol (CHLORASEPTIC) mouth spray 1-2 spray  1-2 spray Mouth/Throat PRN Michael Boston, MD      .  piperacillin-tazobactam (ZOSYN) IVPB 3.375 g  3.375 g Intravenous Q8H Cornett, Thomas, MD 12.5 mL/hr at 02/04/18 0215 3.375 g at 02/04/18 0215  . simethicone (MYLICON) chewable tablet 40 mg  40 mg Oral Q6H PRN Erroll Luna, MD   40 mg at 02/01/18 1117  . sodium chloride flush (NS) 0.9 % injection 3 mL  3 mL Intravenous Gorden Harms, MD   Stopped at 02/03/18 2138  . sodium chloride flush (NS) 0.9 % injection 3 mL  3 mL Intravenous PRN Michael Boston, MD      . zolpidem Lorrin Mais) tablet 5 mg  5 mg Oral QHS PRN Greer Pickerel, MD   5 mg at 02/03/18 2137     No Known Allergies  Signed: Morton Peters, MD, FACS, MASCRS Gastrointestinal and Minimally Invasive Surgery    [address] [place], [place] [address] [phone] Main / Paging [phone] Fax   02/04/2018, 9:09 AM

## 2018-03-22 DIAGNOSIS — R51 Headache: Secondary | ICD-10-CM | POA: Diagnosis not present

## 2018-03-22 DIAGNOSIS — H35033 Hypertensive retinopathy, bilateral: Secondary | ICD-10-CM | POA: Diagnosis not present

## 2018-11-20 ENCOUNTER — Telehealth: Payer: Self-pay | Admitting: *Deleted

## 2018-11-20 NOTE — Telephone Encounter (Signed)
A message was left, re: follow up visit. 

## 2018-12-03 ENCOUNTER — Other Ambulatory Visit: Payer: Self-pay | Admitting: Cardiovascular Disease

## 2019-03-21 ENCOUNTER — Telehealth: Payer: Self-pay | Admitting: Cardiovascular Disease

## 2019-03-21 NOTE — Telephone Encounter (Signed)
LVM on cell phone to call and change VV to office visit for Dr. Gwenlyn Found.  Home phone was busy and could not leave a msg.

## 2019-03-21 NOTE — Telephone Encounter (Signed)
Called patient again and his cell VM box is full and could not leave a msg.

## 2019-03-21 NOTE — Telephone Encounter (Signed)
Called patients home phone and it was busy.

## 2019-03-22 ENCOUNTER — Ambulatory Visit: Payer: BC Managed Care – PPO | Admitting: Cardiovascular Disease

## 2019-03-22 ENCOUNTER — Encounter: Payer: Self-pay | Admitting: Cardiovascular Disease

## 2019-03-22 ENCOUNTER — Other Ambulatory Visit: Payer: Self-pay

## 2019-03-22 DIAGNOSIS — I10 Essential (primary) hypertension: Secondary | ICD-10-CM | POA: Diagnosis not present

## 2019-03-22 DIAGNOSIS — I2510 Atherosclerotic heart disease of native coronary artery without angina pectoris: Secondary | ICD-10-CM | POA: Diagnosis not present

## 2019-03-22 DIAGNOSIS — E782 Mixed hyperlipidemia: Secondary | ICD-10-CM

## 2019-03-22 LAB — HEPATIC FUNCTION PANEL
ALT: 35 IU/L (ref 0–44)
AST: 23 IU/L (ref 0–40)
Albumin: 5 g/dL — ABNORMAL HIGH (ref 3.8–4.9)
Alkaline Phosphatase: 64 IU/L (ref 39–117)
Bilirubin Total: 0.7 mg/dL (ref 0.0–1.2)
Bilirubin, Direct: 0.19 mg/dL (ref 0.00–0.40)
Total Protein: 7.4 g/dL (ref 6.0–8.5)

## 2019-03-22 LAB — LIPID PANEL
Chol/HDL Ratio: 2.9 ratio (ref 0.0–5.0)
Cholesterol, Total: 163 mg/dL (ref 100–199)
HDL: 56 mg/dL (ref >39)
LDL Chol Calc (NIH): 74 mg/dL (ref 0–99)
Triglycerides: 197 mg/dL — ABNORMAL HIGH (ref 0–149)
VLDL Cholesterol Cal: 33 mg/dL (ref 5–40)

## 2019-03-22 NOTE — Patient Instructions (Signed)
Medication Instructions:  Your physician recommends that you continue on your current medications as directed. Please refer to the Current Medication list given to you today.  If you need a refill on your cardiac medications before your next appointment, please call your pharmacy.   Lab work: Your physician recommends that you return for lab work TODAY: FASTING LIPID AND LIVER PANELS  If you have labs (blood work) drawn today and your tests are completely normal, you will receive your results only by: . MyChart Message (if you have MyChart) OR . A paper copy in the mail If you have any lab test that is abnormal or we need to change your treatment, we will call you to review the results.  Testing/Procedures: NONE  Follow-Up: At [facility], you and your health needs are our priority.  As part of our continuing mission to provide you with exceptional heart care, we have created designated Provider Care Teams.  These Care Teams include your primary Cardiologist (physician) and Advanced Practice Providers (APPs -  Physician Assistants and Nurse Practitioners) who all work together to provide you with the care you need, when you need it. You will need a follow up appointment in 12 months with Dr. Jonathan Berry.  Please call our office 2 months in advance to schedule this/each appointment.     

## 2019-03-22 NOTE — Assessment & Plan Note (Signed)
History of hyperlipidemia on statin therapy with recent lipid profile performed 2 years ago revealing total cholesterol 148, LDL 59 HDL 53.  I am going to repeat a lipid liver profile today.

## 2019-03-22 NOTE — Assessment & Plan Note (Signed)
History of CAD status post inferior STEMI with PCI and drug-eluting stenting of the RCA in [facility].  He remains on dual antiplatelet therapy.  He denies chest pain or shortness of breath.

## 2019-03-22 NOTE — Progress Notes (Signed)
03/22/2019 Reginald [name]   06/13/1961  [id number]  Primary Physician Ileana LaddWong, Francis P, MD Primary Cardiologist: Runell GessJonathan J Berry MD Nicholes CalamityFACP, FACC, FAHA, [place]  HPI:  Reginald Harnessndrew [name] is a 58 y.o.  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 I last saw him in the office 11/01/2017. He was self-referred to be established in our practice because of recent STEMI that occurred [place]. He underwent emergency cardiac catheterization, PCI and stenting of his RCA using a drug-eluting stent. He was placed on appropriatetool antiplatelettherapy including beta blocker, ACE inhibitor, statin drug.He's had no recurrent symptoms. His only cardiovascular risk factor was mild hyperlipidemia. Since I saw him 1-1/2 years ago heiscurrently stable specifically denying any cardiovascular symptoms. His major symptoms are fatigue which may be related to his beta blocker.  Since I saw him a year ago he is remained stable.  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  He still feels somewhat fatigued.  He denies chest pain or shortness of breath.    Current Meds  Medication Sig  . aspirin EC 81 MG tablet Take 81 mg by mouth daily.  Marland Kitchen. atorvastatin (LIPITOR) 40 MG tablet TAKE ONE TABLET BY MOUTH DAILY AT 6PM  . Cholecalciferol (VITAMIN D3) 2000 units TABS Take 2,000 Units by mouth daily.  . clopidogrel (PLAVIX) 75 MG tablet TAKE ONE TABLET BY MOUTH DAILY  . co-enzyme Q-10 30 MG capsule Take 1 capsule (30 mg total) by mouth daily.  Marland Kitchen. lisinopril (ZESTRIL) 5 MG tablet TAKE ONE TABLET BY MOUTH DAILY  . metoprolol succinate (TOPROL-XL) 25 MG 24 hr tablet TAKE ONE-HALF TABLET BY MOUTH DAILY  . nitroGLYCERIN (NITROSTAT) 0.4 MG SL tablet Place 1 tablet (0.4 mg total) under the tongue every 5 (five) minutes as needed.     No Known Allergies  Social History   Socioeconomic History  . Marital status: Married    Spouse  name: Not on file  . Number of children: Not on file  . Years of education: Not on file  . Highest education level: Not on file  Occupational History  . Not on file  Social Needs  . Financial resource strain: Not on file  . Food insecurity    Worry: Not on file    Inability: Not on file  . Transportation needs    Medical: Not on file    Non-medical: Not on file  Tobacco Use  . Smoking status: Never Smoker  . Smokeless tobacco: Never Used  Substance and Sexual Activity  . Alcohol use: Yes    Alcohol/week: 11.0 standard drinks    Types: 11 Cans of beer per week  . Drug use: Never  . Sexual activity: Yes  Lifestyle  . Physical activity    Days per week: Not on file    Minutes per session: Not on file  . Stress: Not on file  Relationships  . Social Musicianconnections    Talks on phone: Not on file    Gets together: Not on file    Attends religious service: Not on file    Active member of club or organization: Not on file    Attends meetings of clubs or organizations: Not on file    Relationship status: Not on file  . Intimate partner violence    Fear of current or ex partner: Not on file    Emotionally abused: Not on file  Physically abused: Not on file    Forced sexual activity: Not on file  Other Topics Concern  . Not on file  Social History Narrative  . Not on file     Review of Systems: General: negative for chills, fever, night sweats or weight changes.  Cardiovascular: negative for chest pain, dyspnea on exertion, edema, orthopnea, palpitations, paroxysmal nocturnal dyspnea or shortness of breath Dermatological: negative for rash Respiratory: negative for cough or wheezing Urologic: negative for hematuria Abdominal: negative for nausea, vomiting, diarrhea, bright red blood per rectum, melena, or hematemesis Neurologic: negative for visual changes, syncope, or dizziness All other systems reviewed and are otherwise negative except as noted above.    Blood  pressure 125/88, pulse 67, height 5\' 9"  (1.753 m), weight 214 lb (97.1 kg).  General appearance: alert and no distress Neck: no adenopathy, no carotid bruit, no JVD, supple, symmetrical, trachea midline and thyroid not enlarged, symmetric, no tenderness/mass/nodules Lungs: clear to auscultation bilaterally Heart: regular rate and rhythm, S1, S2 normal, no murmur, click, rub or gallop Extremities: extremities normal, atraumatic, no cyanosis or edema Pulses: 2+ and symmetric Skin: Skin color, texture, turgor normal. No rashes or lesions Neurologic: Alert and oriented X 3, normal strength and tone. Normal symmetric reflexes. Normal coordination and gait  EKG sinus rhythm at 67 without ST or T wave changes.  I personally reviewed this EKG.  ASSESSMENT AND PLAN:   Coronary artery disease History of CAD status post inferior STEMI with PCI and drug-eluting stenting of the RCA in [facility].  He remains on dual antiplatelet therapy.  He denies chest pain or shortness of breath.  Hyperlipidemia History of hyperlipidemia on statin therapy with recent lipid profile performed 2 years ago revealing total cholesterol 148, LDL 59 HDL 53.  I am going to repeat a lipid liver profile today.  Benign hypertension History essentially potential blood pressure measured today 125/88.  He is on lisinopril and metoprolol.      Lorretta Harp MD FACP,FACC,FAHA, [facility] 03/22/2019 10:57 AM

## 2019-03-22 NOTE — Assessment & Plan Note (Signed)
History essentially potential blood pressure measured today 125/88.  He is on lisinopril and metoprolol.

## 2019-09-01 IMAGING — CT CT ABD-PELV W/ CM
2 of 5 series · 16 of 46 positions shown, 18 images · IV contrast (Omni 300)
Comparison: None.

CLINICAL DATA: Right lower quadrant abdominal pain

EXAM:
CT ABDOMEN AND PELVIS WITH CONTRAST
TECHNIQUE: Multidetector CT imaging of the abdomen and pelvis was performed
using the standard protocol following bolus administration of
intravenous contrast.
CONTRAST:  100mL OMNIPAQUE IOHEXOL 300 MG/ML  SOLN

[Series 3: a/p w/ 5mm · axial · 0.88mm/px · z∈[+743,+1238]mm · 13 of 111 slices shown, 15 images]
[im 6/111  soft-tissue]
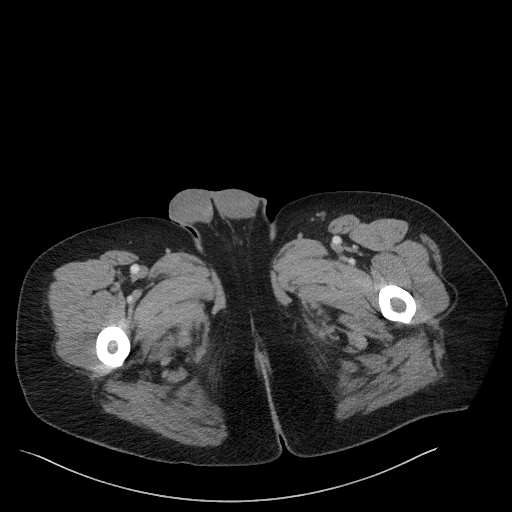
[im 6/111  bone]
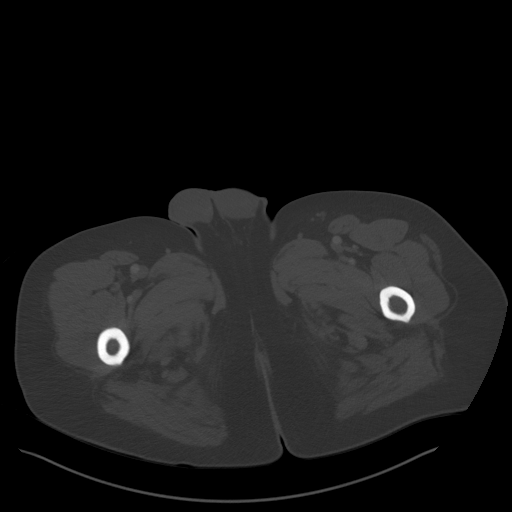
[im 17/111  soft-tissue]
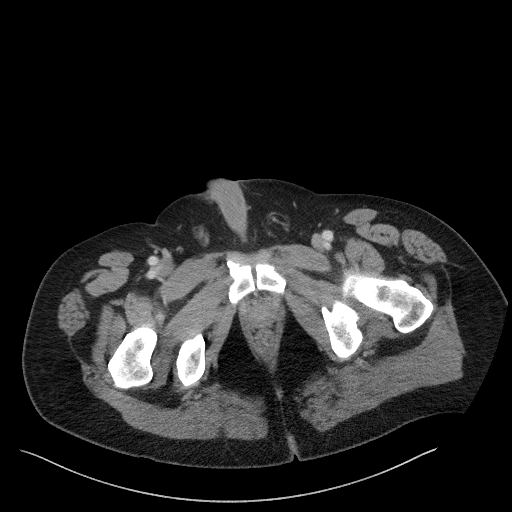
[im 23/111  soft-tissue]
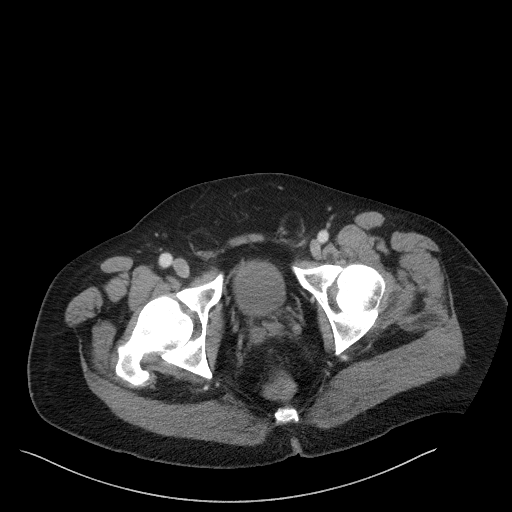
[im 34/111  soft-tissue]
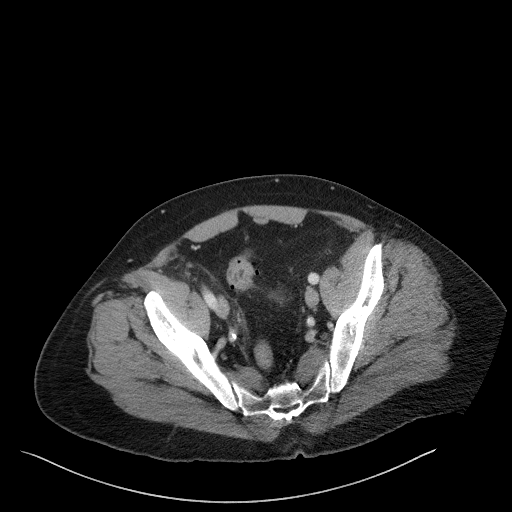
[im 39/111  soft-tissue]
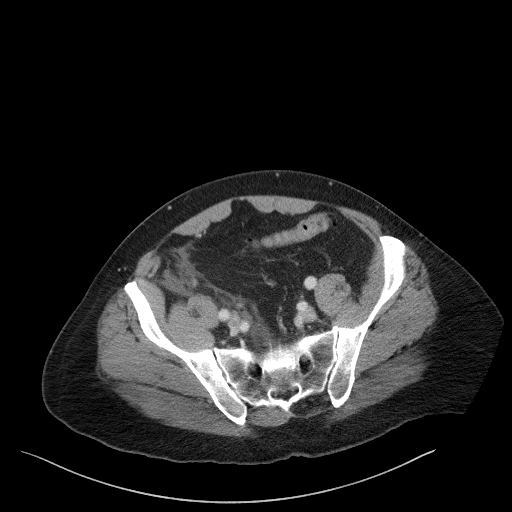
[im 50/111  soft-tissue]
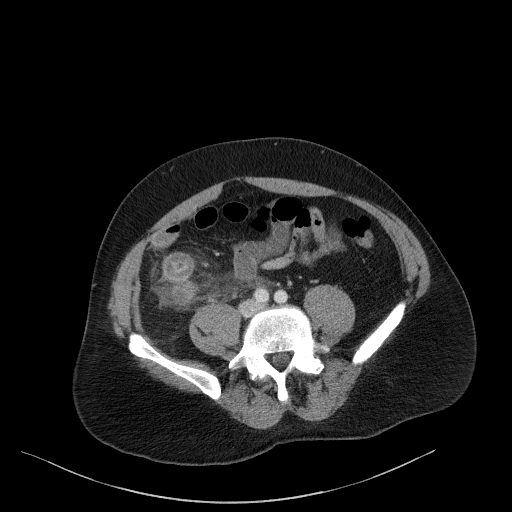
[im 56/111  soft-tissue]
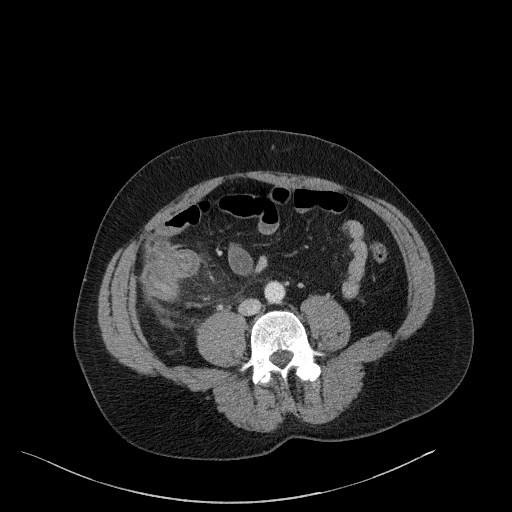
[im 61/111  soft-tissue]
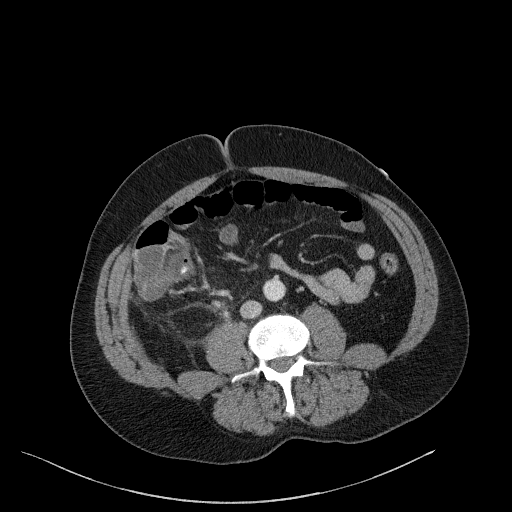
[im 72/111  soft-tissue]
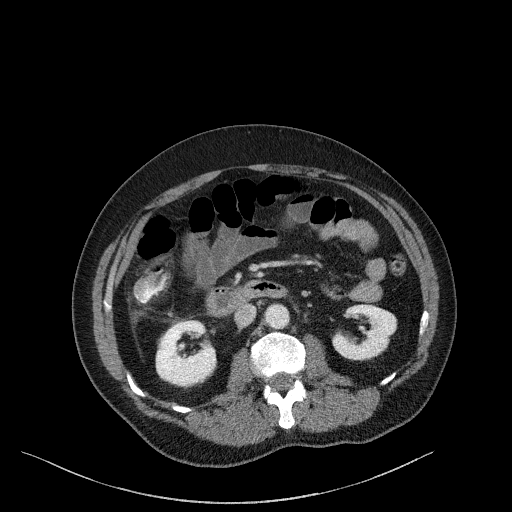
[im 72/111  bone]
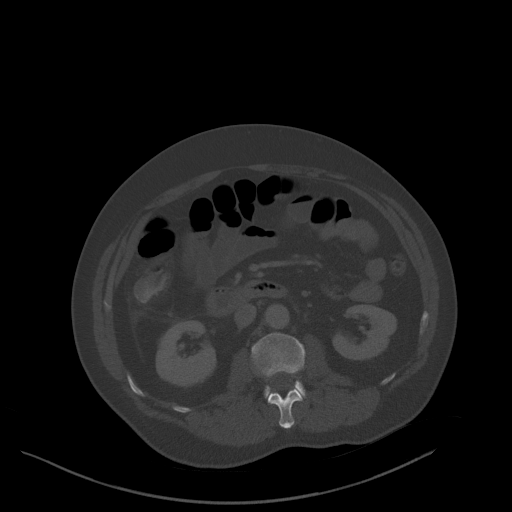
[im 78/111  soft-tissue]
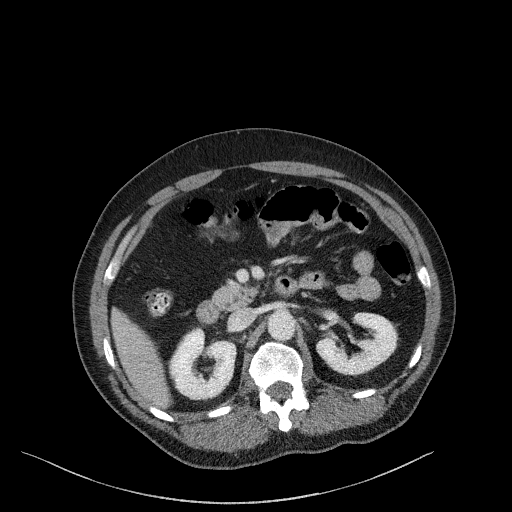
[im 89/111  soft-tissue]
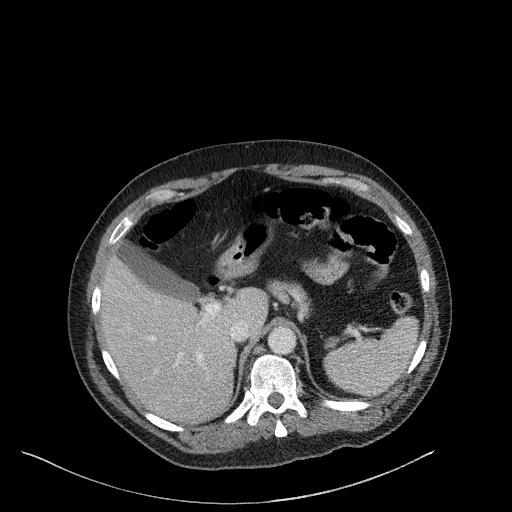
[im 94/111  soft-tissue]
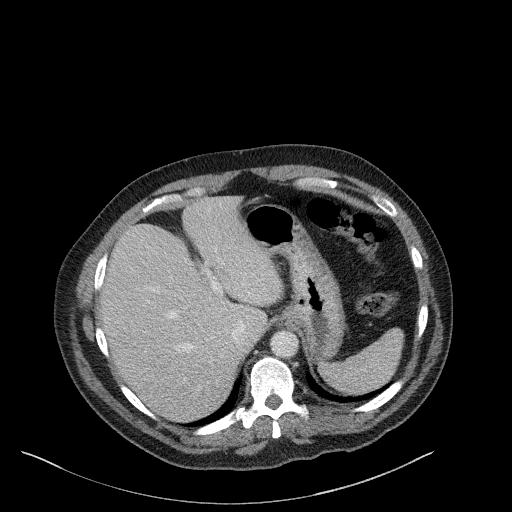
[im 105/111  soft-tissue]
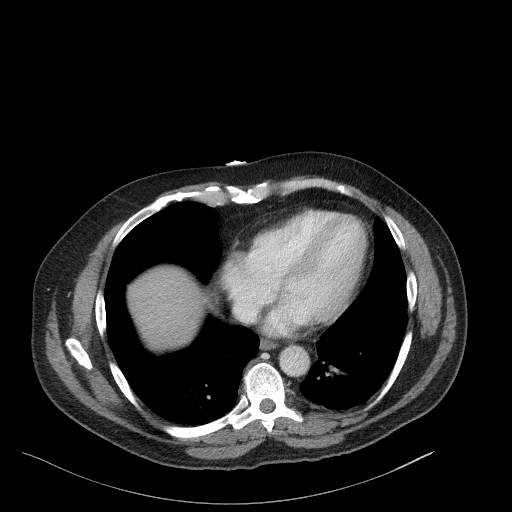

[Series 6: a/p w/ cor · coronal · 0.93mm/px · 3 of 173 slices shown]
[im 58/173  soft-tissue]
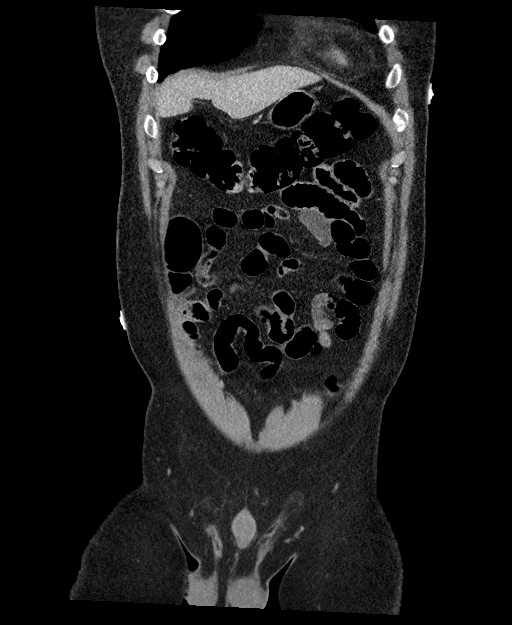
[im 77/173  soft-tissue]
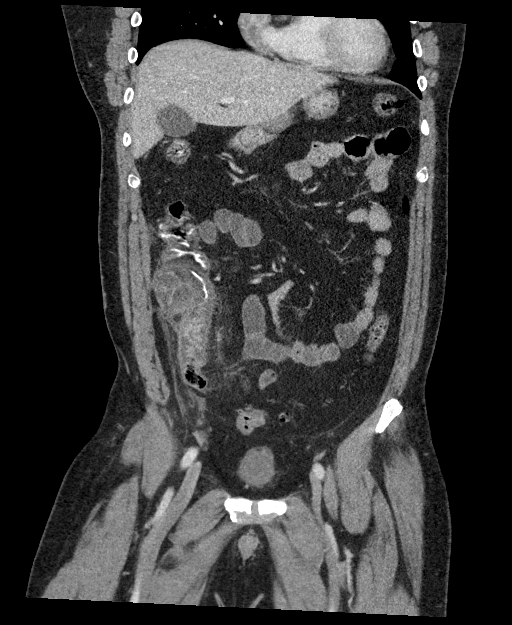
[im 96/173  soft-tissue]
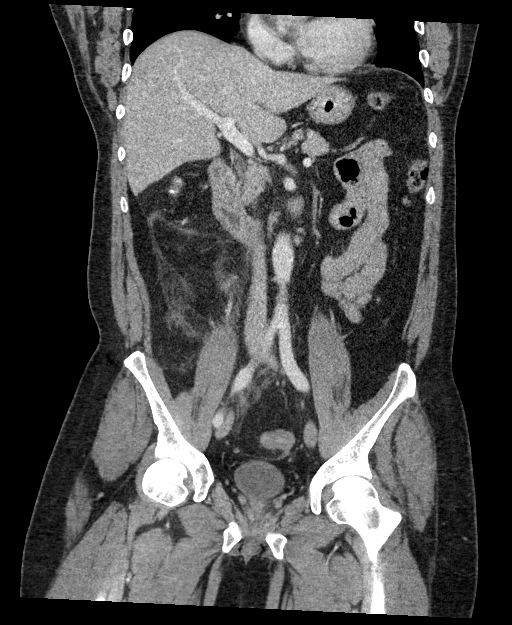

[16 of 46 positions shown; findings below may reference images not displayed]

FINDINGS: Lower chest: Dependent atelectasis.  No effusions.

Hepatobiliary: No focal hepatic abnormality. Gallbladder
unremarkable.

Pancreas: No focal abnormality or ductal dilatation.

Spleen: No focal abnormality.  Normal size.

Adrenals/Urinary Tract: No adrenal abnormality. No focal renal
abnormality. No stones or hydronephrosis. Urinary bladder is
unremarkable.

Stomach/Bowel: Markedly abnormal appendix with mucosal enhancement
and dilatation measuring up to 2 cm. Marked surrounding
inflammation. The terminal ileum is also markedly abnormal and
thickened as is the tip of the cecum. No evidence of bowel
obstruction.

Vascular/Lymphatic: No evidence of aneurysm or adenopathy.

Reproductive: No visible focal abnormality.

Other: Trace free fluid in the pelvis. No free air. Small bilateral
inguinal hernias.

Musculoskeletal: No acute bony abnormality.
IMPRESSION: Markedly abnormal, thickened and dilated appendix as well as
markedly abnormal adjacent terminal ileum. I favor that this
represents acute appendicitis with secondary inflammation of the
adjacent terminal ileum as there appears to be more inflammation
surrounding the appendix than the terminal ileum. It is difficult to
completely exclude inflammatory bowel disease however.

Trace free fluid in the pelvis.

## 2019-09-25 DIAGNOSIS — M19041 Primary osteoarthritis, right hand: Secondary | ICD-10-CM | POA: Diagnosis not present

## 2019-09-25 DIAGNOSIS — S6981XA Other specified injuries of right wrist, hand and finger(s), initial encounter: Secondary | ICD-10-CM | POA: Diagnosis not present

## 2019-09-25 DIAGNOSIS — M79644 Pain in right finger(s): Secondary | ICD-10-CM | POA: Diagnosis not present

## 2019-09-30 ENCOUNTER — Other Ambulatory Visit: Payer: Self-pay | Admitting: Orthopedic Surgery

## 2019-09-30 DIAGNOSIS — M79644 Pain in right finger(s): Secondary | ICD-10-CM

## 2019-10-04 ENCOUNTER — Ambulatory Visit
Admission: RE | Admit: 2019-10-04 | Discharge: 2019-10-04 | Disposition: A | Discharge status: Home / Self Care | Payer: BC Managed Care – PPO | Source: Ambulatory Visit | Attending: Orthopedic Surgery | Admitting: Orthopedic Surgery

## 2019-10-04 DIAGNOSIS — M79644 Pain in right finger(s): Secondary | ICD-10-CM

## 2019-10-04 DIAGNOSIS — M19041 Primary osteoarthritis, right hand: Secondary | ICD-10-CM | POA: Diagnosis not present

## 2020-01-30 ENCOUNTER — Other Ambulatory Visit: Payer: Self-pay | Admitting: Cardiovascular Disease

## 2020-02-18 ENCOUNTER — Other Ambulatory Visit: Payer: Self-pay

## 2020-02-18 ENCOUNTER — Other Ambulatory Visit: Payer: Self-pay | Admitting: Cardiovascular Disease

## 2020-02-18 MED ORDER — METOPROLOL SUCCINATE ER 25 MG PO TB24: 12.5000 mg | ORAL_TABLET | Freq: Every day | ORAL | 1 refills | Status: DC

## 2020-02-18 MED ORDER — ATORVASTATIN CALCIUM 40 MG PO TABS: ORAL_TABLET | ORAL | 1 refills | Status: DC

## 2020-02-21 ENCOUNTER — Other Ambulatory Visit: Payer: Self-pay | Admitting: *Deleted

## 2020-02-21 MED ORDER — CLOPIDOGREL BISULFATE 75 MG PO TABS: 75.0000 mg | ORAL_TABLET | Freq: Every day | ORAL | 0 refills | Status: DC

## 2020-06-19 ENCOUNTER — Ambulatory Visit: Payer: BC Managed Care – PPO | Admitting: Cardiovascular Disease

## 2020-06-19 ENCOUNTER — Encounter: Payer: Self-pay | Admitting: Cardiovascular Disease

## 2020-06-19 ENCOUNTER — Other Ambulatory Visit: Payer: Self-pay

## 2020-06-19 DIAGNOSIS — E782 Mixed hyperlipidemia: Secondary | ICD-10-CM

## 2020-06-19 DIAGNOSIS — I2510 Atherosclerotic heart disease of native coronary artery without angina pectoris: Secondary | ICD-10-CM

## 2020-06-19 DIAGNOSIS — I10 Essential (primary) hypertension: Secondary | ICD-10-CM | POA: Diagnosis not present

## 2020-06-19 MED ORDER — LISINOPRIL 5 MG PO TABS: 5.0000 mg | ORAL_TABLET | Freq: Every day | ORAL | 4 refills | Status: DC

## 2020-06-19 MED ORDER — METOPROLOL SUCCINATE ER 25 MG PO TB24: 12.5000 mg | ORAL_TABLET | Freq: Every day | ORAL | 4 refills | Status: DC

## 2020-06-19 MED ORDER — CLOPIDOGREL BISULFATE 75 MG PO TABS: 75.0000 mg | ORAL_TABLET | Freq: Every day | ORAL | 4 refills | Status: DC

## 2020-06-19 MED ORDER — ATORVASTATIN CALCIUM 40 MG PO TABS: ORAL_TABLET | ORAL | 4 refills | Status: DC

## 2020-06-19 NOTE — Assessment & Plan Note (Signed)
History of CAD status post urgent cath, PCI and stenting of the RCA several years ago in [place].  He has remained asymptomatic since.

## 2020-06-19 NOTE — Progress Notes (Signed)
06/19/2020 Reginald [name]   04-Aug-1960  [id number]  Primary Physician Ileana Ladd, MD Primary Cardiologist: Runell Gess MD Nicholes Calamity, [place]  HPI:  Reginald [name] is a 59 y.o.   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 He underwent emergency cardiac catheterization, PCI and stenting of his RCA using a drug-eluting stent. He was placed on appropriatetool antiplatelettherapy including beta blocker, ACE inhibitor, statin drug.He's had no recurrent symptoms. His only cardiovascular risk factor was mild hyperlipidemia. Since I saw him1-1/2 yearsago heiscurrently stable specifically denying any cardiovascular symptoms.His major symptoms are fatigue which may be related to his beta blocker.  Since I saw him a year ago he is remained stable.  He is currently operating is persisted 100% versus 50% during the peak of COVID-19.  He is otherwise asymptomatic, specifically denying chest pain or shortness of breath.   Current Meds  Medication Sig  . aspirin EC 81 MG tablet Take 81 mg by mouth daily.  Marland Kitchen atorvastatin (LIPITOR) 40 MG tablet TAKE ONE TABLET BY MOUTH DAILY AT 6PM  . Bioflavonoid Products (BIOFLEX PO) Take 1 tablet by mouth daily.  . Cholecalciferol (VITAMIN D3) 2000 units TABS Take 2,000 Units by mouth daily.  . clopidogrel (PLAVIX) 75 MG tablet Take 1 tablet (75 mg total) by mouth daily.  Marland Kitchen co-enzyme Q-10 30 MG capsule Take 1 capsule (30 mg total) by mouth daily.  Marland Kitchen lisinopril (ZESTRIL) 5 MG tablet TAKE ONE TABLET BY MOUTH DAILY  . metoprolol succinate (TOPROL-XL) 25 MG 24 hr tablet Take 0.5 tablets (12.5 mg total) by mouth daily.  . nitroGLYCERIN (NITROSTAT) 0.4 MG SL tablet Place 1 tablet (0.4 mg total) under the tongue every 5  (five) minutes as needed.     No Known Allergies  Social History   Socioeconomic History  . Marital status: Married    Spouse name: Not on file  . Number of children: Not on file  . Years of education: Not on file  . Highest education level: Not on file  Occupational History  . Not on file  Tobacco Use  . Smoking status: Never Smoker  . Smokeless tobacco: Never Used  Vaping Use  . Vaping Use: Never used  Substance and Sexual Activity  . Alcohol use: Yes    Alcohol/week: 11.0 standard drinks    Types: 11 Cans of beer per week  . Drug use: Never  . Sexual activity: Yes  Other Topics Concern  . Not on file  Social History Narrative  . Not on file   Social Determinants of Health   Financial Resource Strain: Not on file  Food Insecurity: Not on file  Transportation Needs: Not on file  Physical Activity: Not on file  Stress: Not on file  Social Connections: Not on file  Intimate Partner Violence: Not on file     Review of Systems: General: negative for chills, fever, night sweats or weight changes.  Cardiovascular: negative for chest pain, dyspnea on exertion, edema, orthopnea, palpitations, paroxysmal nocturnal dyspnea or shortness of breath Dermatological: negative for rash Respiratory: negative for cough or wheezing Urologic: negative for hematuria Abdominal: negative for nausea, vomiting, diarrhea, bright red blood per rectum, melena, or hematemesis Neurologic: negative for visual changes, syncope, or dizziness All other  systems reviewed and are otherwise negative except as noted above.    Blood pressure (!) 140/92, pulse 76, height 5\' 9"  (1.753 m), weight 211 lb (95.7 kg).  General appearance: alert and no distress Neck: no adenopathy, no carotid bruit, no JVD, supple, symmetrical, trachea midline and thyroid not enlarged, symmetric, no tenderness/mass/nodules Lungs: clear to auscultation bilaterally Heart: regular rate and rhythm, S1, S2 normal, no murmur,  click, rub or gallop Extremities: extremities normal, atraumatic, no cyanosis or edema Pulses: 2+ and symmetric Skin: Skin color, texture, turgor normal. No rashes or lesions Neurologic: Alert and oriented X 3, normal strength and tone. Normal symmetric reflexes. Normal coordination and gait  EKG sinus rhythm at 76 without ST or T wave changes.  I Personally reviewed this EKG.  ASSESSMENT AND PLAN:   Coronary artery disease History of CAD status post urgent cath, PCI and stenting of the RCA several years ago in [place].  He has remained asymptomatic since.  Hyperlipidemia History of hyperlipidemia on statin therapy.  His most recent lipid profile performed 03/22/2019 revealed total cholesterol 07/09/1960, LDL 74 and HDL 56.  I am going to repeat a lipid liver profile this morning.  Benign hypertension History of benign essential hypertension blood pressure measured today 140/92.  He is on metoprolol and lisinopril although he has been out of his lisinopril for several days which we will refill.      09/06/1960 MD FACP,FACC,FAHA, [facility] 06/19/2020 9:46 AM

## 2020-06-19 NOTE — Assessment & Plan Note (Signed)
History of benign essential hypertension blood pressure measured today 140/92.  He is on metoprolol and lisinopril although he has been out of his lisinopril for several days which we will refill.

## 2020-06-19 NOTE — Assessment & Plan Note (Signed)
History of hyperlipidemia on statin therapy.  His most recent lipid profile performed 03/22/2019 revealed total cholesterol 07/09/1960, LDL 74 and HDL 56.  I am going to repeat a lipid liver profile this morning.

## 2020-06-19 NOTE — Patient Instructions (Signed)
Medication Instructions:  Your physician recommends that you continue on your current medications as directed. Please refer to the Current Medication list given to you today.  *If you need a refill on your cardiac medications before your next appointment, please call your pharmacy*   Lab Work: Your physician recommends that you labs done today: lipid/liver profile and vit. D   If you have labs (blood work) drawn today and your tests are completely normal, you will receive your results only by: Marland Kitchen MyChart Message (if you have MyChart) OR . A paper copy in the mail If you have any lab test that is abnormal or we need to change your treatment, we will call you to review the results.   Follow-Up: At [facility], you and your health needs are our priority.  As part of our continuing mission to provide you with exceptional heart care, we have created designated Provider Care Teams.  These Care Teams include your primary Cardiologist (physician) and Advanced Practice Providers (APPs -  Physician Assistants and Nurse Practitioners) who all work together to provide you with the care you need, when you need it.  We recommend signing up for the patient portal called "MyChart".  Sign up information is provided on this After Visit Summary.  MyChart is used to connect with patients for Virtual Visits (Telemedicine).  Patients are able to view lab/test results, encounter notes, upcoming appointments, etc.  Non-urgent messages can be sent to your provider as well.   To learn more about what you can do with MyChart, go to [email or web address].    Your next appointment:   12 month(s)  The format for your next appointment:   In Person  Provider:   Nanetta Batty, MD

## 2020-06-26 LAB — LIPID PANEL
Chol/HDL Ratio: 2.1 ratio (ref 0.0–5.0)
Cholesterol, Total: 152 mg/dL (ref 100–199)
HDL: 71 mg/dL (ref >39)
LDL Chol Calc (NIH): 57 mg/dL (ref 0–99)
Triglycerides: 143 mg/dL (ref 0–149)
VLDL Cholesterol Cal: 24 mg/dL (ref 5–40)

## 2020-06-26 LAB — HEPATIC FUNCTION PANEL
ALT: 34 IU/L (ref 0–44)
AST: 18 IU/L (ref 0–40)
Albumin: 4.7 g/dL (ref 3.8–4.9)
Alkaline Phosphatase: 70 IU/L (ref 44–121)
Bilirubin Total: 0.6 mg/dL (ref 0.0–1.2)
Bilirubin, Direct: 0.21 mg/dL (ref 0.00–0.40)
Total Protein: 7.4 g/dL (ref 6.0–8.5)

## 2020-06-26 LAB — VITAMIN D 1,25 DIHYDROXY
Vitamin D 1, 25 (OH)2 Total: 71 pg/mL — ABNORMAL HIGH
Vitamin D2 1, 25 (OH)2: <10 pg/mL
Vitamin D3 1, 25 (OH)2: 70 pg/mL

## 2021-03-09 DIAGNOSIS — H35033 Hypertensive retinopathy, bilateral: Secondary | ICD-10-CM | POA: Diagnosis not present

## 2021-12-27 DIAGNOSIS — J40 Bronchitis, not specified as acute or chronic: Secondary | ICD-10-CM | POA: Diagnosis not present

## 2022-04-18 DIAGNOSIS — J3489 Other specified disorders of nose and nasal sinuses: Secondary | ICD-10-CM | POA: Diagnosis not present

## 2023-01-24 ENCOUNTER — Other Ambulatory Visit: Payer: Self-pay | Admitting: Cardiovascular Disease

## 2023-02-09 ENCOUNTER — Telehealth: Payer: Self-pay | Admitting: Cardiovascular Disease

## 2023-02-09 MED ORDER — LISINOPRIL 5 MG PO TABS: 5.0000 mg | ORAL_TABLET | Freq: Every day | ORAL | 1 refills | Status: DC

## 2023-02-09 NOTE — Telephone Encounter (Signed)
*  STAT* If patient is at the pharmacy, call can be transferred to refill team.   1. Which medications need to be refilled? (please list name of each medication and dose if known) lisinopril (ZESTRIL) 5 MG tablet   2. Which pharmacy/location (including street and city if local pharmacy) is medication to be sent to?  [id number] - [place], [place] - [address]    3. Do they need a 30 day or 90 day supply? 90  Patient has appt 10/16

## 2023-02-09 NOTE — Telephone Encounter (Signed)
Pt's medication was sent to pt's pharmacy as requested. Confirmation received.  °

## 2023-03-08 ENCOUNTER — Telehealth: Payer: Self-pay | Admitting: Cardiovascular Disease

## 2023-03-08 MED ORDER — METOPROLOL SUCCINATE ER 25 MG PO TB24: 12.5000 mg | ORAL_TABLET | Freq: Every day | ORAL | 1 refills | Status: DC

## 2023-03-08 MED ORDER — ATORVASTATIN CALCIUM 40 MG PO TABS: ORAL_TABLET | ORAL | 1 refills | Status: DC

## 2023-03-08 MED ORDER — CLOPIDOGREL BISULFATE 75 MG PO TABS: 75.0000 mg | ORAL_TABLET | Freq: Every day | ORAL | 1 refills | Status: DC

## 2023-03-08 NOTE — Telephone Encounter (Signed)
*  STAT* If patient is at the pharmacy, call can be transferred to refill team.   1. Which medications need to be refilled? (please list name of each medication and dose if known)   atorvastatin (LIPITOR) 40 MG tablet  clopidogrel (PLAVIX) 75 MG tablet  metoprolol succinate (TOPROL-XL) 25 MG 24 hr tablet   2. Would you like to learn more about the convenience, safety, & potential cost savings by using the [facility]?   3. Are you open to using the [facility] (Type [facility]. ).  4. Which pharmacy/location (including street and city if local pharmacy) is medication to be sent to?  [facility] [id number] - [place], [place] - [address]   5. Do they need a 30 day or 90 day supply?   90 day  Patient stated he is almost out of these medications.  Patient has appointment scheduled on 10/16.

## 2023-03-08 NOTE — Telephone Encounter (Signed)
Pt's medications were sent to pt's pharmacy as requested. Confirmation received.  

## 2023-04-19 ENCOUNTER — Encounter: Payer: Self-pay | Admitting: Cardiovascular Disease

## 2023-04-19 ENCOUNTER — Ambulatory Visit: Payer: BC Managed Care – PPO | Attending: Cardiovascular Disease | Admitting: Cardiovascular Disease

## 2023-04-19 VITALS — BP 128/88 | HR 67 | Ht 69.0 in | Wt 212.4 lb

## 2023-04-19 DIAGNOSIS — I10 Essential (primary) hypertension: Secondary | ICD-10-CM

## 2023-04-19 DIAGNOSIS — E782 Mixed hyperlipidemia: Secondary | ICD-10-CM

## 2023-04-19 DIAGNOSIS — I2510 Atherosclerotic heart disease of native coronary artery without angina pectoris: Secondary | ICD-10-CM

## 2023-04-19 NOTE — Assessment & Plan Note (Signed)
History of essential hypertension with blood pressure measured today at 128/88.  He is on metoprolol and lisinopril.

## 2023-04-19 NOTE — Assessment & Plan Note (Signed)
History of CAD status post remote STEMI in [facility] with PCI and drug-eluting stenting of the RCA.  He is completely asymptomatic and remains on aspirin and clopidogrel.

## 2023-04-19 NOTE — Progress Notes (Signed)
04/19/2023 Dierdre Harness   12/15/1960  [id number]  Primary Physician Ileana Ladd, MD (Inactive) Primary Cardiologist: Runell Gess MD Milagros Loll, [place], [place]  HPI:  Reginald [name] is a 62 y.o.   married Caucasian male father of 2 sons age 42 and 33 who  works as a Sports administrator of the [employer] . I last saw him in the office 06/19/2020. He was self-referred to be established in our practice because of recent STEMI that occurred in [place]. He underwent emergency cardiac catheterization, PCI and stenting of his RCA using a drug-eluting stent. He was placed on appropriate tool antiplatelet therapy including beta blocker, ACE inhibitor, statin drug.He's had no recurrent symptoms. His only cardiovascular risk factor was mild hyperlipidemia.    Since I saw him on 3 years ago he is remained stable.  He denies chest pain or shortness of breath.  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  Current Meds  Medication Sig   aspirin EC 81 MG tablet Take 81 mg by mouth daily.   atorvastatin (LIPITOR) 40 MG tablet TAKE ONE TABLET BY MOUTH DAILY AT 6PM   Bioflavonoid Products (BIOFLEX PO) Take 1 tablet by mouth daily.   Cholecalciferol (VITAMIN D3) 2000 units TABS Take 2,000 Units by mouth daily.   clopidogrel (PLAVIX) 75 MG tablet Take 1 tablet (75 mg total) by mouth daily.   co-enzyme Q-10 30 MG capsule Take 1 capsule (30 mg total) by mouth daily.   lisinopril (ZESTRIL) 5 MG tablet Take 1 tablet (5 mg total) by mouth daily.   metoprolol succinate (TOPROL-XL) 25 MG 24 hr tablet Take 0.5 tablets (12.5 mg total) by mouth daily.   nitroGLYCERIN (NITROSTAT) 0.4 MG SL tablet Place 1 tablet (0.4 mg total) under the tongue every 5 (five) minutes as needed.     No Known Allergies  Social History   Socioeconomic History   Marital status: Married    Spouse name: Not on file   Number of children: Not on file   Years of education: Not on  file   Highest education level: Not on file  Occupational History   Not on file  Tobacco Use   Smoking status: Never   Smokeless tobacco: Never  Vaping Use   Vaping status: Never Used  Substance and Sexual Activity   Alcohol use: Yes    Alcohol/week: 11.0 standard drinks of alcohol    Types: 11 Cans of beer per week   Drug use: Never   Sexual activity: Yes  Other Topics Concern   Not on file  Social History Narrative   Not on file   Social Determinants of Health   Financial Resource Strain: Not on file  Food Insecurity: Not on file  Transportation Needs: Not on file  Physical Activity: Not on file  Stress: Not on file  Social Connections: Not on file  Intimate Partner Violence: Not on file     Review of Systems: General: negative for chills, fever, night sweats or weight changes.  Cardiovascular: negative for chest pain, dyspnea on exertion, edema, orthopnea, palpitations, paroxysmal nocturnal dyspnea or shortness of breath Dermatological: negative for rash Respiratory: negative for cough or wheezing Urologic: negative for hematuria Abdominal: negative for nausea, vomiting, diarrhea, bright red blood per rectum, melena, or hematemesis Neurologic: negative for visual changes, syncope, or dizziness All other systems reviewed and are otherwise negative except as noted above.  Blood pressure 128/88, pulse 67, height 5\' 9"  (1.753 m), weight 212 lb 6.4 oz (96.3 kg), SpO2 97%.  General appearance: alert and no distress Neck: no adenopathy, no carotid bruit, no JVD, supple, symmetrical, trachea midline, and thyroid not enlarged, symmetric, no tenderness/mass/nodules Lungs: clear to auscultation bilaterally Heart: regular rate and rhythm, S1, S2 normal, no murmur, click, rub or gallop Extremities: extremities normal, atraumatic, no cyanosis or edema Pulses: 2+ and symmetric Skin: Skin color, texture, turgor normal. No rashes or lesions Neurologic: Grossly  normal  EKG EKG Interpretation Date/Time:  Wednesday April 19 2023 10:07:32 EDT Ventricular Rate:  67 PR Interval:  182 QRS Duration:  90 QT Interval:  414 QTC Calculation: 437 R Axis:   -15  Text Interpretation: Normal sinus rhythm Inferior infarct (cited on or before 31-Jan-2018) When compared with ECG of 31-Jan-2018 04:36, Premature ventricular complexes are no longer Present Vent. rate has decreased BY  54 BPM Minimal criteria for Anterior infarct are no longer Present ST no longer depressed in Lateral leads Nonspecific T wave abnormality now evident in Lateral leads Confirmed by Nanetta Batty [phone]) on 04/19/2023 10:17:11 AM    ASSESSMENT AND PLAN:   Coronary artery disease History of CAD status post remote STEMI in [facility] with PCI and drug-eluting stenting of the RCA.  He is completely asymptomatic and remains on aspirin and clopidogrel.  Hyperlipidemia History of hyperlipidemia on atorvastatin with lipid profile performed 06/19/2020 revealing a total cholesterol 152, LDL 57 and HDL of 71.  Will repeat a lipid liver profile.  Benign hypertension History of essential hypertension with blood pressure measured today at 128/88.  He is on metoprolol and lisinopril.     Runell Gess MD FACP,FACC,FAHA, [facility] 04/19/2023 10:25 AM

## 2023-04-19 NOTE — Assessment & Plan Note (Signed)
History of hyperlipidemia on atorvastatin with lipid profile performed 06/19/2020 revealing a total cholesterol 152, LDL 57 and HDL of 71.  Will repeat a lipid liver profile.

## 2023-04-19 NOTE — Patient Instructions (Signed)
Medication Instructions:  Your physician recommends that you continue on your current medications as directed. Please refer to the Current Medication list given to you today.  *If you need a refill on your cardiac medications before your next appointment, please call your pharmacy*   Lab Work: Your physician recommends that you have labs drawn today: CMET, CBC, Lipids, TSH, PSA  If you have labs (blood work) drawn today and your tests are completely normal, you will receive your results only by: MyChart Message (if you have MyChart) OR A paper copy in the mail If you have any lab test that is abnormal or we need to change your treatment, we will call you to review the results.   Follow-Up: At [facility], you and your health needs are our priority.  As part of our continuing mission to provide you with exceptional heart care, we have created designated Provider Care Teams.  These Care Teams include your primary Cardiologist (physician) and Advanced Practice Providers (APPs -  Physician Assistants and Nurse Practitioners) who all work together to provide you with the care you need, when you need it.  We recommend signing up for the patient portal called "MyChart".  Sign up information is provided on this After Visit Summary.  MyChart is used to connect with patients for Virtual Visits (Telemedicine).  Patients are able to view lab/test results, encounter notes, upcoming appointments, etc.  Non-urgent messages can be sent to your provider as well.   To learn more about what you can do with MyChart, go to [email or web address].    Your next appointment:   12 month(s)  Provider:   Nanetta Batty, MD

## 2023-04-20 LAB — COMPREHENSIVE METABOLIC PANEL
ALT: 37 [IU]/L (ref 0–44)
AST: 25 [IU]/L (ref 0–40)
Albumin: 4.6 g/dL (ref 3.9–4.9)
Alkaline Phosphatase: 66 [IU]/L (ref 44–121)
BUN/Creatinine Ratio: 15 (ref 10–24)
BUN: 16 mg/dL (ref 8–27)
Bilirubin Total: 0.7 mg/dL (ref 0.0–1.2)
CO2: 22 mmol/L (ref 20–29)
Calcium: 9.5 mg/dL (ref 8.6–10.2)
Chloride: 99 mmol/L (ref 96–106)
Creatinine, Ser: 1.08 mg/dL (ref 0.76–1.27)
Globulin, Total: 2.6 g/dL (ref 1.5–4.5)
Glucose: 96 mg/dL (ref 70–99)
Potassium: 4.5 mmol/L (ref 3.5–5.2)
Sodium: 136 mmol/L (ref 134–144)
Total Protein: 7.2 g/dL (ref 6.0–8.5)
eGFR: 78 mL/min/{1.73_m2} (ref >59)

## 2023-04-20 LAB — CBC
Hematocrit: 45.2 % (ref 37.5–51.0)
Hemoglobin: 15.2 g/dL (ref 13.0–17.7)
MCH: 30.8 pg (ref 26.6–33.0)
MCHC: 33.6 g/dL (ref 31.5–35.7)
MCV: 92 fL (ref 79–97)
Platelets: 261 10*3/uL (ref 150–450)
RBC: 4.94 x10E6/uL (ref 4.14–5.80)
RDW: 12.8 % (ref 11.6–15.4)
WBC: 8.7 10*3/uL (ref 3.4–10.8)

## 2023-04-20 LAB — LIPID PANEL
Chol/HDL Ratio: 3.1 {ratio} (ref 0.0–5.0)
Cholesterol, Total: 174 mg/dL (ref 100–199)
HDL: 57 mg/dL (ref >39)
LDL Chol Calc (NIH): 81 mg/dL (ref 0–99)
Triglycerides: 218 mg/dL — ABNORMAL HIGH (ref 0–149)
VLDL Cholesterol Cal: 36 mg/dL (ref 5–40)

## 2023-04-20 LAB — TSH: TSH: 1.71 u[IU]/mL (ref 0.450–4.500)

## 2023-04-20 LAB — PSA: Prostate Specific Ag, Serum: 0.1 ng/mL (ref 0.0–4.0)

## 2023-04-25 ENCOUNTER — Other Ambulatory Visit: Payer: Self-pay

## 2023-04-25 DIAGNOSIS — E782 Mixed hyperlipidemia: Secondary | ICD-10-CM

## 2023-05-09 ENCOUNTER — Other Ambulatory Visit: Payer: Self-pay | Admitting: Cardiovascular Disease

## 2023-07-06 ENCOUNTER — Other Ambulatory Visit: Payer: Self-pay

## 2023-07-06 MED ORDER — METOPROLOL SUCCINATE ER 25 MG PO TB24: 12.5000 mg | ORAL_TABLET | Freq: Every day | ORAL | 2 refills | Status: AC

## 2023-07-06 MED ORDER — CLOPIDOGREL BISULFATE 75 MG PO TABS: 75.0000 mg | ORAL_TABLET | Freq: Every day | ORAL | 2 refills | Status: AC

## 2023-07-06 MED ORDER — ATORVASTATIN CALCIUM 40 MG PO TABS: ORAL_TABLET | ORAL | 2 refills | Status: AC
